# Patient Record
Sex: Female | Born: 1965 | Race: Black or African American | Hispanic: No | Marital: Married | Smoking: Never smoker
Health system: Southern US, Community
[De-identification: ages and names within clinical notes are randomized; demographics above are authoritative.]

## PROBLEM LIST (undated history)

## (undated) DIAGNOSIS — R51 Headache: Secondary | ICD-10-CM

## (undated) DIAGNOSIS — T7840XA Allergy, unspecified, initial encounter: Secondary | ICD-10-CM

## (undated) HISTORY — DX: Allergy, unspecified, initial encounter: T78.40XA

## (undated) HISTORY — PX: TUBAL LIGATION: SHX77

## (undated) HISTORY — PX: OTHER SURGICAL HISTORY: SHX169

---

## 2007-05-09 ENCOUNTER — Encounter
Admission: RE | Admit: 2007-05-09 | Discharge: 2007-07-20 | Payer: Self-pay | Admitting: Physical Medicine and Rehabilitation

## 2007-05-11 ENCOUNTER — Ambulatory Visit (HOSPITAL_COMMUNITY)
Admission: RE | Admit: 2007-05-11 | Discharge: 2007-05-11 | Payer: Self-pay | Admitting: Physical Medicine and Rehabilitation

## 2007-06-16 ENCOUNTER — Encounter
Admission: RE | Admit: 2007-06-16 | Discharge: 2007-06-16 | Payer: Self-pay | Admitting: Physical Medicine and Rehabilitation

## 2007-06-29 ENCOUNTER — Encounter
Admission: RE | Admit: 2007-06-29 | Discharge: 2007-06-29 | Payer: Self-pay | Admitting: Physical Medicine and Rehabilitation

## 2009-11-30 ENCOUNTER — Emergency Department (HOSPITAL_COMMUNITY): Admission: EM | Admit: 2009-11-30 | Discharge: 2009-12-01 | Payer: Self-pay | Admitting: Emergency Medicine

## 2013-01-13 ENCOUNTER — Encounter (HOSPITAL_COMMUNITY): Payer: Self-pay | Admitting: Emergency Medicine

## 2013-01-13 ENCOUNTER — Emergency Department (HOSPITAL_COMMUNITY): Payer: No Typology Code available for payment source

## 2013-01-13 ENCOUNTER — Observation Stay (HOSPITAL_COMMUNITY): Payer: No Typology Code available for payment source

## 2013-01-13 ENCOUNTER — Observation Stay (HOSPITAL_COMMUNITY)
Admission: EM | Admit: 2013-01-13 | Discharge: 2013-01-14 | Disposition: A | Discharge status: Home / Self Care | Payer: No Typology Code available for payment source | Attending: Neurological Surgery | Admitting: Neurological Surgery

## 2013-01-13 DIAGNOSIS — S139XXA Sprain of joints and ligaments of unspecified parts of neck, initial encounter: Principal | ICD-10-CM | POA: Insufficient documentation

## 2013-01-13 DIAGNOSIS — M25519 Pain in unspecified shoulder: Secondary | ICD-10-CM | POA: Insufficient documentation

## 2013-01-13 DIAGNOSIS — M4712 Other spondylosis with myelopathy, cervical region: Secondary | ICD-10-CM | POA: Insufficient documentation

## 2013-01-13 DIAGNOSIS — R109 Unspecified abdominal pain: Secondary | ICD-10-CM | POA: Insufficient documentation

## 2013-01-13 DIAGNOSIS — M5000 Cervical disc disorder with myelopathy, unspecified cervical region: Secondary | ICD-10-CM | POA: Diagnosis present

## 2013-01-13 MED ORDER — SODIUM CHLORIDE 0.9 % IV SOLN
INTRAVENOUS | Status: DC
  Administered 2013-01-13: 15:00:00 via INTRAVENOUS

## 2013-01-13 MED ORDER — MORPHINE SULFATE 2 MG/ML IJ SOLN
1.0000 mg | INTRAMUSCULAR | Status: DC | PRN
  Administered 2013-01-13: 1 mg via INTRAVENOUS
  Filled 2013-01-13: qty 1

## 2013-01-13 MED ORDER — LORATADINE 10 MG PO TABS
10.0000 mg | ORAL_TABLET | Freq: Every day | ORAL | Status: DC
  Filled 2013-01-13 (×2): qty 1

## 2013-01-13 MED ORDER — HYDROCODONE-ACETAMINOPHEN 5-325 MG PO TABS: 1.0000 | ORAL_TABLET | ORAL | Status: DC | PRN

## 2013-01-13 MED ORDER — HYDROMORPHONE HCL PF 1 MG/ML IJ SOLN
0.5000 mg | Freq: Once | INTRAMUSCULAR | Status: AC
  Administered 2013-01-13: 0.5 mg via INTRAVENOUS
  Filled 2013-01-13: qty 1

## 2013-01-13 MED ORDER — ALUM & MAG HYDROXIDE-SIMETH 200-200-20 MG/5ML PO SUSP: 30.0000 mL | Freq: Four times a day (QID) | ORAL | Status: DC | PRN

## 2013-01-13 MED ORDER — KETOROLAC TROMETHAMINE 15 MG/ML IJ SOLN
15.0000 mg | Freq: Four times a day (QID) | INTRAMUSCULAR | Status: DC | PRN
  Administered 2013-01-13: 15 mg via INTRAVENOUS
  Filled 2013-01-13: qty 1

## 2013-01-13 MED ORDER — DOCUSATE SODIUM 100 MG PO CAPS
100.0000 mg | ORAL_CAPSULE | Freq: Two times a day (BID) | ORAL | Status: DC
  Administered 2013-01-13 – 2013-01-14 (×3): 100 mg via ORAL
  Filled 2013-01-13 (×2): qty 1

## 2013-01-13 MED ORDER — POLYETHYLENE GLYCOL 3350 17 G PO PACK: 17.0000 g | PACK | Freq: Every day | ORAL | Status: DC | PRN

## 2013-01-13 MED ORDER — BISACODYL 10 MG RE SUPP: 10.0000 mg | Freq: Every day | RECTAL | Status: DC | PRN

## 2013-01-13 MED ORDER — SENNA 8.6 MG PO TABS
1.0000 | ORAL_TABLET | Freq: Two times a day (BID) | ORAL | Status: DC
  Administered 2013-01-13 – 2013-01-14 (×2): 8.6 mg via ORAL
  Filled 2013-01-13 (×3): qty 1

## 2013-01-13 MED ORDER — DEXAMETHASONE SODIUM PHOSPHATE 4 MG/ML IJ SOLN
8.0000 mg | Freq: Four times a day (QID) | INTRAMUSCULAR | Status: DC
  Administered 2013-01-13 – 2013-01-14 (×3): 8 mg via INTRAVENOUS
  Filled 2013-01-13 (×8): qty 2
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
## 2013-01-13 NOTE — ED Notes (Signed)
Aspen collar applied while maintaining c spine  

## 2013-01-13 NOTE — ED Notes (Signed)
Patient remains in radiology
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
## 2013-01-13 NOTE — ED Notes (Signed)
Report called to Kenney Houseman, RN unit 5N.

## 2013-01-14 MED ORDER — PREDNISONE (PAK) 10 MG PO TABS: ORAL_TABLET | ORAL | Status: DC

## 2013-01-14 MED ORDER — HYDROCODONE-ACETAMINOPHEN 5-325 MG PO TABS: 1.0000 | ORAL_TABLET | ORAL | Status: DC | PRN

## 2013-01-14 NOTE — Progress Notes (Signed)
Patient ID: Meghan Travis, female   DOB: 02-14-1965, 47 y.o.   MRN: [id number] Vital signs have been stable. Clinically patient feels much improved. Left upper extremity strength appears 5 out of 5 in intrinsics biceps deltoid triceps and grip. Station and gait are intact. Pain under reasonable control. We'll discharge home in a hard cervical collar. Followup in 2 weeks' time. Six-day prednisone dose course.
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
## 2013-02-14 ENCOUNTER — Other Ambulatory Visit: Payer: Self-pay | Admitting: Neurological Surgery

## 2013-02-15 ENCOUNTER — Encounter (HOSPITAL_COMMUNITY): Payer: Self-pay | Admitting: Pharmacy Technician

## 2013-02-16 ENCOUNTER — Encounter (HOSPITAL_COMMUNITY): Payer: Self-pay | Admitting: *Deleted

## 2013-02-19 ENCOUNTER — Encounter (HOSPITAL_COMMUNITY): Payer: No Typology Code available for payment source | Admitting: Certified Registered Nurse Anesthetist

## 2013-02-19 ENCOUNTER — Ambulatory Visit (HOSPITAL_COMMUNITY)
Admission: RE | Admit: 2013-02-19 | Discharge: 2013-02-20 | Disposition: A | Discharge status: Home / Self Care | Payer: No Typology Code available for payment source | Source: Ambulatory Visit | Attending: Neurological Surgery | Admitting: Neurological Surgery

## 2013-02-19 ENCOUNTER — Ambulatory Visit (HOSPITAL_COMMUNITY): Payer: No Typology Code available for payment source | Admitting: Certified Registered Nurse Anesthetist

## 2013-02-19 ENCOUNTER — Encounter (HOSPITAL_COMMUNITY): Payer: Self-pay | Admitting: *Deleted

## 2013-02-19 ENCOUNTER — Encounter (HOSPITAL_COMMUNITY)
Admission: RE | Disposition: A | Discharge status: Home / Self Care | Payer: Self-pay | Source: Ambulatory Visit | Attending: Neurological Surgery

## 2013-02-19 ENCOUNTER — Ambulatory Visit (HOSPITAL_COMMUNITY): Payer: No Typology Code available for payment source

## 2013-02-19 DIAGNOSIS — M5020 Other cervical disc displacement, unspecified cervical region: Secondary | ICD-10-CM | POA: Insufficient documentation

## 2013-02-19 DIAGNOSIS — M4712 Other spondylosis with myelopathy, cervical region: Secondary | ICD-10-CM | POA: Insufficient documentation

## 2013-02-19 HISTORY — PX: ANTERIOR CERVICAL DECOMP/DISCECTOMY FUSION: SHX1161

## 2013-02-19 HISTORY — DX: Headache: R51

## 2013-02-19 LAB — CBC
HCT: 36.4 % (ref 36.0–46.0)
Hemoglobin: 12.5 g/dL (ref 12.0–15.0)
MCH: 32.1 pg (ref 26.0–34.0)
MCHC: 34.3 g/dL (ref 30.0–36.0)
MCV: 93.3 fL (ref 78.0–100.0)
Platelets: 212 10*3/uL (ref 150–400)
RBC: 3.9 MIL/uL (ref 3.87–5.11)
RDW: 12.7 % (ref 11.5–15.5)
WBC: 3.2 10*3/uL — ABNORMAL LOW (ref 4.0–10.5)

## 2013-02-19 LAB — SURGICAL PCR SCREEN
MRSA, PCR: NEGATIVE
Staphylococcus aureus: NEGATIVE

## 2013-02-19 LAB — HCG, SERUM, QUALITATIVE: Preg, Serum: NEGATIVE

## 2013-02-19 SURGERY — ANTERIOR CERVICAL DECOMPRESSION/DISCECTOMY FUSION 2 LEVELS
Anesthesia: General | Site: Spine Cervical

## 2013-02-19 MED ORDER — LORATADINE 10 MG PO TABS
10.0000 mg | ORAL_TABLET | Freq: Every day | ORAL | Status: DC
  Filled 2013-02-19 (×2): qty 1

## 2013-02-19 MED ORDER — BUPIVACAINE HCL (PF) 0.5 % IJ SOLN
INTRAMUSCULAR | Status: DC | PRN
  Administered 2013-02-19: 3.5 mL

## 2013-02-19 MED ORDER — OXYCODONE-ACETAMINOPHEN 5-325 MG PO TABS: 1.0000 | ORAL_TABLET | ORAL | Status: DC | PRN

## 2013-02-19 MED ORDER — DOCUSATE SODIUM 100 MG PO CAPS
100.0000 mg | ORAL_CAPSULE | Freq: Two times a day (BID) | ORAL | Status: DC
  Administered 2013-02-20: 100 mg via ORAL
  Filled 2013-02-19 (×4): qty 1

## 2013-02-19 MED ORDER — LIDOCAINE-EPINEPHRINE 1 %-1:100000 IJ SOLN
INTRAMUSCULAR | Status: DC | PRN
  Administered 2013-02-19: 3.5 mL

## 2013-02-19 MED ORDER — SODIUM CHLORIDE 0.9 % IJ SOLN: 3.0000 mL | INTRAMUSCULAR | Status: DC | PRN

## 2013-02-19 MED ORDER — MENTHOL 3 MG MT LOZG
1.0000 | LOZENGE | OROMUCOSAL | Status: DC | PRN
Start: 2013-02-19 — End: 2013-02-20

## 2013-02-19 MED ORDER — MEPERIDINE HCL 25 MG/ML IJ SOLN: 6.2500 mg | INTRAMUSCULAR | Status: DC | PRN

## 2013-02-19 MED ORDER — OXYCODONE HCL 5 MG/5ML PO SOLN: 5.0000 mg | Freq: Once | ORAL | Status: DC | PRN

## 2013-02-19 MED ORDER — PROMETHAZINE HCL 25 MG/ML IJ SOLN: 6.2500 mg | INTRAMUSCULAR | Status: DC | PRN

## 2013-02-19 MED ORDER — PHENYLEPHRINE HCL 10 MG/ML IJ SOLN
INTRAMUSCULAR | Status: DC | PRN
  Administered 2013-02-19 (×3): 80 ug via INTRAVENOUS

## 2013-02-19 MED ORDER — FENTANYL CITRATE 0.05 MG/ML IJ SOLN
INTRAMUSCULAR | Status: DC | PRN
  Administered 2013-02-19: 150 ug via INTRAVENOUS
  Administered 2013-02-19: 100 ug via INTRAVENOUS
  Administered 2013-02-19 (×2): 50 ug via INTRAVENOUS

## 2013-02-19 MED ORDER — MUPIROCIN 2 % EX OINT
TOPICAL_OINTMENT | CUTANEOUS | Status: AC
  Filled 2013-02-19: qty 22

## 2013-02-19 MED ORDER — MIDAZOLAM HCL 5 MG/5ML IJ SOLN
INTRAMUSCULAR | Status: DC | PRN
  Administered 2013-02-19: 2 mg via INTRAVENOUS

## 2013-02-19 MED ORDER — CEFAZOLIN SODIUM-DEXTROSE 2-3 GM-% IV SOLR
INTRAVENOUS | Status: AC
  Administered 2013-02-19: 2 g via INTRAVENOUS
  Filled 2013-02-19: qty 50

## 2013-02-19 MED ORDER — POLYETHYLENE GLYCOL 3350 17 G PO PACK
17.0000 g | PACK | Freq: Every day | ORAL | Status: DC | PRN
  Filled 2013-02-19: qty 1

## 2013-02-19 MED ORDER — MORPHINE SULFATE 2 MG/ML IJ SOLN: 1.0000 mg | INTRAMUSCULAR | Status: DC | PRN

## 2013-02-19 MED ORDER — DEXAMETHASONE SODIUM PHOSPHATE 10 MG/ML IJ SOLN
INTRAMUSCULAR | Status: AC
  Administered 2013-02-19: 10 mg via INTRAVENOUS
  Filled 2013-02-19: qty 1

## 2013-02-19 MED ORDER — ONDANSETRON HCL 4 MG/2ML IJ SOLN
INTRAMUSCULAR | Status: DC | PRN
  Administered 2013-02-19: 4 mg via INTRAVENOUS

## 2013-02-19 MED ORDER — PHENOL 1.4 % MT LIQD: 1.0000 | OROMUCOSAL | Status: DC | PRN

## 2013-02-19 MED ORDER — METHOCARBAMOL 500 MG PO TABS: 500.0000 mg | ORAL_TABLET | Freq: Four times a day (QID) | ORAL | Status: DC | PRN

## 2013-02-19 MED ORDER — METHOCARBAMOL 100 MG/ML IJ SOLN
500.0000 mg | Freq: Four times a day (QID) | INTRAMUSCULAR | Status: DC | PRN
  Filled 2013-02-19: qty 5

## 2013-02-19 MED ORDER — LACTATED RINGERS IV SOLN
INTRAVENOUS | Status: DC | PRN
  Administered 2013-02-19 (×2): via INTRAVENOUS

## 2013-02-19 MED ORDER — 0.9 % SODIUM CHLORIDE (POUR BTL) OPTIME
TOPICAL | Status: DC | PRN
  Administered 2013-02-19: 1000 mL

## 2013-02-19 MED ORDER — ALUM & MAG HYDROXIDE-SIMETH 200-200-20 MG/5ML PO SUSP: 30.0000 mL | Freq: Four times a day (QID) | ORAL | Status: DC | PRN

## 2013-02-19 MED ORDER — DEXAMETHASONE 4 MG PO TABS
4.0000 mg | ORAL_TABLET | Freq: Two times a day (BID) | ORAL | Status: DC
  Administered 2013-02-20: 4 mg via ORAL
  Filled 2013-02-19 (×2): qty 1

## 2013-02-19 MED ORDER — NEOSTIGMINE METHYLSULFATE 1 MG/ML IJ SOLN
INTRAMUSCULAR | Status: DC | PRN
  Administered 2013-02-19: 3 mg via INTRAVENOUS

## 2013-02-19 MED ORDER — MUPIROCIN 2 % EX OINT
TOPICAL_OINTMENT | Freq: Two times a day (BID) | CUTANEOUS | Status: DC
  Administered 2013-02-19: 1 via NASAL
  Filled 2013-02-19: qty 22

## 2013-02-19 MED ORDER — SODIUM CHLORIDE 0.9 % IV SOLN
250.0000 mL | INTRAVENOUS | Status: DC
  Administered 2013-02-19: 250 mL via INTRAVENOUS

## 2013-02-19 MED ORDER — ONDANSETRON HCL 4 MG/2ML IJ SOLN
4.0000 mg | INTRAMUSCULAR | Status: DC | PRN
  Administered 2013-02-19 (×2): 4 mg via INTRAVENOUS
  Filled 2013-02-19 (×2): qty 2

## 2013-02-19 MED ORDER — EPHEDRINE SULFATE 50 MG/ML IJ SOLN
INTRAMUSCULAR | Status: DC | PRN
  Administered 2013-02-19: 10 mg via INTRAVENOUS

## 2013-02-19 MED ORDER — SENNA 8.6 MG PO TABS
1.0000 | ORAL_TABLET | Freq: Two times a day (BID) | ORAL | Status: DC
  Administered 2013-02-20: 8.6 mg via ORAL
  Filled 2013-02-19 (×2): qty 1

## 2013-02-19 MED ORDER — HEMOSTATIC AGENTS (NO CHARGE) OPTIME
TOPICAL | Status: DC | PRN
  Administered 2013-02-19: 1 via TOPICAL

## 2013-02-19 MED ORDER — PROPOFOL 10 MG/ML IV BOLUS
INTRAVENOUS | Status: DC | PRN
  Administered 2013-02-19: 100 mg via INTRAVENOUS

## 2013-02-19 MED ORDER — HYDROMORPHONE HCL PF 1 MG/ML IJ SOLN: 0.2500 mg | INTRAMUSCULAR | Status: DC | PRN

## 2013-02-19 MED ORDER — ACETAMINOPHEN 650 MG RE SUPP: 650.0000 mg | RECTAL | Status: DC | PRN

## 2013-02-19 MED ORDER — GLYCOPYRROLATE 0.2 MG/ML IJ SOLN
INTRAMUSCULAR | Status: DC | PRN
  Administered 2013-02-19: 0.4 mg via INTRAVENOUS

## 2013-02-19 MED ORDER — DEXAMETHASONE SODIUM PHOSPHATE 4 MG/ML IJ SOLN
4.0000 mg | Freq: Two times a day (BID) | INTRAMUSCULAR | Status: DC
  Administered 2013-02-19: 4 mg via INTRAVENOUS
  Filled 2013-02-19 (×3): qty 1

## 2013-02-19 MED ORDER — SODIUM CHLORIDE 0.9 % IJ SOLN
3.0000 mL | Freq: Two times a day (BID) | INTRAMUSCULAR | Status: DC
  Administered 2013-02-19 – 2013-02-20 (×3): 3 mL via INTRAVENOUS

## 2013-02-19 MED ORDER — MIDAZOLAM HCL 2 MG/2ML IJ SOLN: 0.5000 mg | Freq: Once | INTRAMUSCULAR | Status: DC | PRN

## 2013-02-19 MED ORDER — BACITRACIN 50000 UNITS IM SOLR
INTRAMUSCULAR | Status: DC | PRN
  Administered 2013-02-19: 09:00:00

## 2013-02-19 MED ORDER — ROCURONIUM BROMIDE 100 MG/10ML IV SOLN
INTRAVENOUS | Status: DC | PRN
  Administered 2013-02-19: 10 mg via INTRAVENOUS
  Administered 2013-02-19: 40 mg via INTRAVENOUS

## 2013-02-19 MED ORDER — BISACODYL 10 MG RE SUPP: 10.0000 mg | Freq: Every day | RECTAL | Status: DC | PRN

## 2013-02-19 MED ORDER — HYDROCODONE-ACETAMINOPHEN 5-325 MG PO TABS
1.0000 | ORAL_TABLET | ORAL | Status: DC | PRN
  Administered 2013-02-19: 2 via ORAL
  Filled 2013-02-19: qty 2

## 2013-02-19 MED ORDER — ACETAMINOPHEN 325 MG PO TABS
650.0000 mg | ORAL_TABLET | ORAL | Status: DC | PRN
  Administered 2013-02-20: 650 mg via ORAL
  Filled 2013-02-19: qty 2

## 2013-02-19 MED ORDER — LIDOCAINE HCL (CARDIAC) 20 MG/ML IV SOLN
INTRAVENOUS | Status: DC | PRN
  Administered 2013-02-19: 40 mg via INTRAVENOUS

## 2013-02-19 MED ORDER — OXYCODONE HCL 5 MG PO TABS: 5.0000 mg | ORAL_TABLET | Freq: Once | ORAL | Status: DC | PRN

## 2013-02-19 MED ORDER — THROMBIN 5000 UNITS EX SOLR
CUTANEOUS | Status: DC | PRN
  Administered 2013-02-19 (×2): 5000 [IU] via TOPICAL

## 2013-02-19 SURGICAL SUPPLY — 56 items
BAG DECANTER FOR FLEXI CONT (MISCELLANEOUS) ×2 IMPLANT
BANDAGE GAUZE ELAST BULKY 4 IN (GAUZE/BANDAGES/DRESSINGS) ×4 IMPLANT
BIT DRILL NEURO 2X3.1 SFT TUCH (MISCELLANEOUS) ×1 IMPLANT
BUR BARREL STRAIGHT FLUTE 4.0 (BURR) ×2 IMPLANT
CANISTER SUCT 3000ML (MISCELLANEOUS) ×2 IMPLANT
CONT SPEC 4OZ CLIKSEAL STRL BL (MISCELLANEOUS) ×2 IMPLANT
DECANTER SPIKE VIAL GLASS SM (MISCELLANEOUS) ×2 IMPLANT
DERMABOND ADHESIVE PROPEN (GAUZE/BANDAGES/DRESSINGS) ×1
DERMABOND ADVANCED (GAUZE/BANDAGES/DRESSINGS)
DERMABOND ADVANCED .7 DNX12 (GAUZE/BANDAGES/DRESSINGS) IMPLANT
DERMABOND ADVANCED .7 DNX6 (GAUZE/BANDAGES/DRESSINGS) ×1 IMPLANT
DRAPE LAPAROTOMY 100X72 PEDS (DRAPES) ×2 IMPLANT
DRAPE MICROSCOPE LEICA (MISCELLANEOUS) IMPLANT
DRAPE POUCH INSTRU U-SHP 10X18 (DRAPES) ×2 IMPLANT
DRILL BIT HELIX (BIT) ×2 IMPLANT
DRILL NEURO 2X3.1 SOFT TOUCH (MISCELLANEOUS) ×2
DURAPREP 6ML APPLICATOR 50/CS (WOUND CARE) ×2 IMPLANT
ELECT REM PT RETURN 9FT ADLT (ELECTROSURGICAL) ×2
ELECTRODE REM PT RTRN 9FT ADLT (ELECTROSURGICAL) ×1 IMPLANT
GAUZE SPONGE 4X4 16PLY XRAY LF (GAUZE/BANDAGES/DRESSINGS) IMPLANT
GLOVE BIOGEL PI IND STRL 8 (GLOVE) ×2 IMPLANT
GLOVE BIOGEL PI IND STRL 8.5 (GLOVE) ×1 IMPLANT
GLOVE BIOGEL PI INDICATOR 8 (GLOVE) ×2
GLOVE BIOGEL PI INDICATOR 8.5 (GLOVE) ×1
GLOVE ECLIPSE 7.5 STRL STRAW (GLOVE) ×8 IMPLANT
GLOVE ECLIPSE 8.5 STRL (GLOVE) ×4 IMPLANT
GLOVE EXAM NITRILE LRG STRL (GLOVE) IMPLANT
GLOVE EXAM NITRILE MD LF STRL (GLOVE) IMPLANT
GLOVE EXAM NITRILE XL STR (GLOVE) IMPLANT
GLOVE EXAM NITRILE XS STR PU (GLOVE) IMPLANT
GOWN BRE IMP SLV AUR LG STRL (GOWN DISPOSABLE) ×2 IMPLANT
GOWN BRE IMP SLV AUR XL STRL (GOWN DISPOSABLE) IMPLANT
GOWN STRL REIN 2XL LVL4 (GOWN DISPOSABLE) ×4 IMPLANT
HEAD HALTER (SOFTGOODS) ×2 IMPLANT
INTERLOCK LORDOTIC CC 5X11X14M ×2 IMPLANT
KIT BASIN OR (CUSTOM PROCEDURE TRAY) ×2 IMPLANT
KIT ROOM TURNOVER OR (KITS) ×2 IMPLANT
LORDOTIC CC ALLOGRAFT 5X11X14M ×4 IMPLANT
NEEDLE HYPO 22GX1.5 SAFETY (NEEDLE) ×2 IMPLANT
NEEDLE SPNL 22GX3.5 QUINCKE BK (NEEDLE) ×4 IMPLANT
NS IRRIG 1000ML POUR BTL (IV SOLUTION) ×2 IMPLANT
PACK LAMINECTOMY NEURO (CUSTOM PROCEDURE TRAY) ×2 IMPLANT
PAD ARMBOARD 7.5X6 YLW CONV (MISCELLANEOUS) ×6 IMPLANT
PLATE HELIX T 34MM (Plate) ×2 IMPLANT
PUTTY DBX 1CC (Putty) ×2 IMPLANT
PUTTY DBX 1CC DEPUY (Putty) ×1 IMPLANT
RUBBERBAND STERILE (MISCELLANEOUS) IMPLANT
SCREW HELIX R SELF TAP 11MM (Screw) ×12 IMPLANT
SPONGE GAUZE 4X4 12PLY (GAUZE/BANDAGES/DRESSINGS) IMPLANT
SPONGE INTESTINAL PEANUT (DISPOSABLE) ×2 IMPLANT
SPONGE SURGIFOAM ABS GEL SZ50 (HEMOSTASIS) ×2 IMPLANT
SUT VIC AB 3-0 SH 8-18 (SUTURE) ×4 IMPLANT
SYR 20ML ECCENTRIC (SYRINGE) ×2 IMPLANT
TOWEL OR 17X24 6PK STRL BLUE (TOWEL DISPOSABLE) ×2 IMPLANT
TOWEL OR 17X26 10 PK STRL BLUE (TOWEL DISPOSABLE) ×2 IMPLANT
WATER STERILE IRR 1000ML POUR (IV SOLUTION) ×2 IMPLANT

## 2013-02-19 NOTE — Transfer of Care (Signed)
Immediate Anesthesia Transfer of Care Note  Patient: Meghan Travis  Procedure(s) Performed: Procedure(s): CERVICAL FOUR-FIVE,CERVICAL FIVE -SIX ANTERIOR CERVICAL DECOMPRESSION/FUSION (N/A)  Patient Location: PACU  Anesthesia Type:General  Level of Consciousness: awake, alert  and oriented  Airway & Oxygen Therapy: Patient Spontanous Breathing and Patient connected to nasal cannula oxygen  Post-op Assessment: Report given to PACU RN, Post -op Vital signs reviewed and stable and Patient moving all extremities  Post vital signs: Reviewed and stable  Complications: No apparent anesthesia complications

## 2013-02-19 NOTE — Progress Notes (Signed)
Transported by Dora NT  

## 2013-02-19 NOTE — Anesthesia Procedure Notes (Signed)
Procedure Name: Intubation Date/Time: 02/19/2013 8:00 AM Performed by: Trixie Deis A Pre-anesthesia Checklist: Patient identified, Timeout performed, Emergency Drugs available, Suction available and Patient being monitored Patient Re-evaluated:Patient Re-evaluated prior to inductionOxygen Delivery Method: Circle system utilized Preoxygenation: Pre-oxygenation with 100% oxygen Intubation Type: IV induction Ventilation: Mask ventilation without difficulty Tube type: Oral Tube size: 7.0 mm Number of attempts: 1 Airway Equipment and Method: Rigid stylet,  LTA kit utilized and Video-laryngoscopy (glidescope used due to myelopathy) Placement Confirmation: breath sounds checked- equal and bilateral,  ETT inserted through vocal cords under direct vision and positive ETCO2 Secured at: 21 cm Tube secured with: Tape Dental Injury: Teeth and Oropharynx as per pre-operative assessment
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
## 2013-02-19 NOTE — Anesthesia Postprocedure Evaluation (Signed)
  Anesthesia Post-op Note  Patient: Jonna MunroSharon H Butzin  Procedure(s) Performed: Procedure(s): CERVICAL FOUR-FIVE,CERVICAL FIVE -SIX ANTERIOR CERVICAL DECOMPRESSION/FUSION (N/A)  Patient Location: PACU  Anesthesia Type:General  Level of Consciousness: awake, alert , oriented and patient cooperative  Airway and Oxygen Therapy: Patient Spontanous Breathing and Patient connected to nasal cannula oxygen  Post-op Pain: none  Post-op Assessment: Post-op Vital signs reviewed, Patient's Cardiovascular Status Stable, Respiratory Function Stable, Patent Airway, No signs of Nausea or vomiting and Pain level controlled  Post-op Vital Signs: Reviewed and stable  Complications: No apparent anesthesia complications
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
## 2013-02-19 NOTE — Plan of Care (Signed)
Problem: Consults Goal: Diagnosis - Spinal Surgery Outcome: Completed/Met Date Met:  02/19/13 Cervical Spine Fusion
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
## 2013-02-20 ENCOUNTER — Encounter (HOSPITAL_COMMUNITY): Payer: Self-pay | Admitting: Neurological Surgery

## 2013-02-20 MED ORDER — OXYCODONE-ACETAMINOPHEN 5-325 MG PO TABS: 1.0000 | ORAL_TABLET | ORAL | Status: DC | PRN

## 2013-02-20 MED ORDER — METHOCARBAMOL 500 MG PO TABS: 500.0000 mg | ORAL_TABLET | Freq: Four times a day (QID) | ORAL | Status: DC | PRN

## 2013-02-20 MED ORDER — DEXAMETHASONE 1 MG PO TABS: ORAL_TABLET | ORAL | Status: DC

## 2013-02-20 NOTE — Progress Notes (Signed)
Pt. Alert and oriented,follows simple instructions, denies pain. Incision area without swelling, redness or S/S of infection. Voiding adequate clear yellow urine. Moving all extremities well and vitals stable and documented. Patient discharged home with family. Anterior cervical surgery notes instructions given to patient and family member for home safety and precautions. Pt. and family stated understanding of instructions given. 

## 2013-02-20 NOTE — Discharge Summary (Signed)
Physician Discharge Summary  Patient ID: Meghan Travis MRN: [id number] DOB/AGE: 47-15-1967 47 y.o.  Admit date: 02/19/2013 Discharge date: 02/20/2013  Admission Diagnoses: Cervical spondylosis with myelopathy and herniated nucleus pulposus C4-5 C5-C6, quadriparesis  Discharge Diagnoses: Cervical spondylosis with myelopathy and herniated nucleus pulposus C4-5 and C5-6, quadriparesis Active Problems:   Cervical spondylosis with myelopathy   Discharged Condition: good  Hospital Course: Patient was admitted to undergo anterior cervical decompression arthrodesis at C4-5 and C5-6. She tolerated the surgery well.  Consults: None  Significant Diagnostic Studies: None  Treatments: Anterior cervical decompression C4-5 C5-6 arthrodesis with structural allograft anterior plate fixation [id number]  Discharge Exam: Blood pressure 112/71, pulse 83, temperature 99.2 F (37.3 C), temperature source Oral, resp. rate 20, height 5\' 6"  (1.676 m), weight 58.514 kg (129 lb), last menstrual period 02/01/2013, SpO2 98.00%. Incision is clean and dry. Motor function appears good in the deltoids biceps triceps grips and intrinsics. Station and gait is improving.  Disposition: 01-Home or Self Care  Discharge Orders   Future Orders Complete By Expires   Call MD for:  redness, tenderness, or signs of infection (pain, swelling, redness, odor or green/yellow discharge around incision site)  As directed    Call MD for:  severe uncontrolled pain  As directed    Call MD for:  temperature >100.4  As directed    Diet - low sodium heart healthy  As directed    Increase activity slowly  As directed        Medication List         dexamethasone 1 MG tablet  Commonly known as:  DECADRON  2 tablets twice daily for 2 days then 1 tablet twice daily for 2 days then 1 tablet daily until finished.     HYDROcodone-acetaminophen 5-325 MG per tablet  Commonly known as:  NORCO/VICODIN  Take 1-2 tablets by mouth every 3  (three) hours as needed for moderate pain.     ibuprofen 200 MG tablet  Commonly known as:  ADVIL,MOTRIN  Take 200 mg by mouth every 6 (six) hours as needed for mild pain or moderate pain.     loratadine 10 MG tablet  Commonly known as:  CLARITIN  Take 10 mg by mouth daily.     methocarbamol 500 MG tablet  Commonly known as:  ROBAXIN  Take 1 tablet (500 mg total) by mouth every 6 (six) hours as needed for muscle spasms.     oxyCODONE-acetaminophen 5-325 MG per tablet  Commonly known as:  PERCOCET/ROXICET  Take 1-2 tablets by mouth every 4 (four) hours as needed for moderate pain.         SignedStefani Dama: [name] 02/20/2013, 1:41 PM
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
## 2013-10-12 ENCOUNTER — Ambulatory Visit (INDEPENDENT_AMBULATORY_CARE_PROVIDER_SITE_OTHER): Payer: BC Managed Care – PPO | Admitting: Family Medicine

## 2013-10-12 VITALS — BP 110/72 | HR 80 | Temp 97.9°F | Resp 18 | Ht 66.5 in | Wt 128.0 lb

## 2013-10-12 DIAGNOSIS — Z1329 Encounter for screening for other suspected endocrine disorder: Secondary | ICD-10-CM

## 2013-10-12 DIAGNOSIS — M62838 Other muscle spasm: Secondary | ICD-10-CM

## 2013-10-12 DIAGNOSIS — Z130 Encounter for screening for diseases of the blood and blood-forming organs and certain disorders involving the immune mechanism: Secondary | ICD-10-CM

## 2013-10-12 DIAGNOSIS — Z124 Encounter for screening for malignant neoplasm of cervix: Secondary | ICD-10-CM

## 2013-10-12 DIAGNOSIS — Z13220 Encounter for screening for lipoid disorders: Secondary | ICD-10-CM

## 2013-10-12 DIAGNOSIS — J018 Other acute sinusitis: Secondary | ICD-10-CM

## 2013-10-12 DIAGNOSIS — Z0000 Encounter for general adult medical examination without abnormal findings: Secondary | ICD-10-CM

## 2013-10-12 DIAGNOSIS — J3089 Other allergic rhinitis: Secondary | ICD-10-CM

## 2013-10-12 DIAGNOSIS — Z131 Encounter for screening for diabetes mellitus: Secondary | ICD-10-CM

## 2013-10-12 DIAGNOSIS — Z23 Encounter for immunization: Secondary | ICD-10-CM

## 2013-10-12 MED ORDER — MONTELUKAST SODIUM 10 MG PO TABS: 10.0000 mg | ORAL_TABLET | Freq: Every day | ORAL | Status: DC

## 2013-10-12 MED ORDER — BACLOFEN 10 MG PO TABS: ORAL_TABLET | ORAL | Status: DC

## 2013-10-12 MED ORDER — AMOXICILLIN 500 MG PO CAPS: 1000.0000 mg | ORAL_CAPSULE | Freq: Two times a day (BID) | ORAL | Status: DC
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
## 2013-10-12 NOTE — Patient Instructions (Addendum)
I will be in touch with your labs and pap report.  The fastest way to get your labs is to sign up for mychart!    Please schedule a screening mammogram soon!  [facility]  Address: [address], [place], [place] [address]  [phone]  For allergies, continue your claritin but try adding singulair once a day Amoxicillin for sinus infection   Try the baclofen for muscle spasm to see if it is more helpful/

## 2013-10-13 LAB — COMPREHENSIVE METABOLIC PANEL
ALT: 10 U/L (ref 0–35)
AST: 18 U/L (ref 0–37)
Albumin: 4.6 g/dL (ref 3.5–5.2)
Alkaline Phosphatase: 81 U/L (ref 39–117)
BUN: 15 mg/dL (ref 6–23)
CO2: 25 mEq/L (ref 19–32)
Calcium: 9.5 mg/dL (ref 8.4–10.5)
Chloride: 103 mEq/L (ref 96–112)
Creat: 0.62 mg/dL (ref 0.50–1.10)
Glucose, Bld: 93 mg/dL (ref 70–99)
Potassium: 3.9 mEq/L (ref 3.5–5.3)
Sodium: 138 mEq/L (ref 135–145)
Total Bilirubin: 0.6 mg/dL (ref 0.2–1.2)
Total Protein: 7.4 g/dL (ref 6.0–8.3)

## 2013-10-13 LAB — CBC
HCT: 37.2 % (ref 36.0–46.0)
Hemoglobin: 12.9 g/dL (ref 12.0–15.0)
MCH: 31.9 pg (ref 26.0–34.0)
MCHC: 34.7 g/dL (ref 30.0–36.0)
MCV: 92.1 fL (ref 78.0–100.0)
Platelets: 205 10*3/uL (ref 150–400)
RBC: 4.04 MIL/uL (ref 3.87–5.11)
RDW: 13.6 % (ref 11.5–15.5)
WBC: 3.5 10*3/uL — ABNORMAL LOW (ref 4.0–10.5)

## 2013-10-13 LAB — LDL CHOLESTEROL, DIRECT: Direct LDL: 89 mg/dL

## 2013-10-13 LAB — TSH: TSH: 0.783 u[IU]/mL (ref 0.350–4.500)

## 2013-10-16 ENCOUNTER — Encounter: Payer: Self-pay | Admitting: Family Medicine

## 2013-10-16 LAB — PAP IG AND HPV HIGH-RISK: HPV DNA High Risk: NOT DETECTED

## 2013-10-22 ENCOUNTER — Telehealth: Payer: Self-pay | Admitting: Radiology

## 2013-10-22 NOTE — Telephone Encounter (Signed)
Called her- I have sent her a letter with details but I am sorry she has not received it yet.  All her labs looked ok.  Let me know if she does not receive it soon

## 2013-10-22 NOTE — Telephone Encounter (Signed)
Pt calling about lab results  

## 2014-05-16 ENCOUNTER — Ambulatory Visit (INDEPENDENT_AMBULATORY_CARE_PROVIDER_SITE_OTHER): Payer: BLUE CROSS/BLUE SHIELD

## 2014-05-16 ENCOUNTER — Ambulatory Visit (INDEPENDENT_AMBULATORY_CARE_PROVIDER_SITE_OTHER): Payer: BLUE CROSS/BLUE SHIELD | Admitting: Family Medicine

## 2014-05-16 VITALS — BP 115/74 | HR 116 | Temp 102.7°F | Resp 18

## 2014-05-16 DIAGNOSIS — R05 Cough: Secondary | ICD-10-CM

## 2014-05-16 DIAGNOSIS — R509 Fever, unspecified: Secondary | ICD-10-CM

## 2014-05-16 DIAGNOSIS — R059 Cough, unspecified: Secondary | ICD-10-CM

## 2014-05-16 MED ORDER — IBUPROFEN 200 MG PO TABS
400.0000 mg | ORAL_TABLET | Freq: Once | ORAL | Status: AC
  Administered 2014-05-16: 400 mg via ORAL

## 2014-05-16 MED ORDER — ONDANSETRON 4 MG PO TBDP: 4.0000 mg | ORAL_TABLET | Freq: Three times a day (TID) | ORAL | Status: DC | PRN

## 2014-05-16 MED ORDER — OSELTAMIVIR PHOSPHATE 75 MG PO CAPS: 75.0000 mg | ORAL_CAPSULE | Freq: Two times a day (BID) | ORAL | Status: DC

## 2014-05-16 NOTE — Progress Notes (Addendum)
Meghan HaroldSharon Travis is a 49 y.o. female who presents to Urgent Care today with complaints of malaise:  1.  Illness:  Patient with cough, body aches, and chills x 2-3 days.  No known sick contacts.  No known fevers, only inability to get warm, especially at night.  Rhinorrhea which is clear. Cough in nonproductive.  Nausea without overt vomiting.  Hasn't eaten past several days.  Is drinking PO fluids.  Biggest complaint is myalgias.  Has tried Ibuprofen and hydrocodone at home without relief.    Received flu shot in September  PMH reviewed.  Past Medical History  Diagnosis Date  . Headache(784.0)     miagrine-  . Allergy    Past Surgical History  Procedure Laterality Date  . Cesarean section    . Anterior cervical decomp/discectomy fusion N/A 02/19/2013    Procedure: CERVICAL FOUR-FIVE,CERVICAL FIVE -SIX ANTERIOR CERVICAL DECOMPRESSION/FUSION;  Surgeon: Barnett AbuHenry Elsner, MD;  Location: [facility];  Service: Neurosurgery;  Laterality: N/A;  . Tubal ligation    . Tubal reveral       Medications reviewed. Current Outpatient Prescriptions  Medication Sig Dispense Refill  . ibuprofen (ADVIL,MOTRIN) 200 MG tablet Take 200 mg by mouth every 6 (six) hours as needed for mild pain or moderate pain.    . montelukast (SINGULAIR) 10 MG tablet Take 1 tablet (10 mg total) by mouth at bedtime. 30 tablet 5  . baclofen (LIORESAL) 10 MG tablet Start with 5mg  (1/2 tab) up to 3x a day as needed . May increase to a whole pill (Patient not taking: Reported on 05/16/2014) 90 each 1  . loratadine (CLARITIN) 10 MG tablet Take 10 mg by mouth daily.     No current facility-administered medications for this visit.    ROS as above otherwise neg.  No chest pain, palpitations, SOB, Fever, Chills, Abd pain, N/V/D.   Physical Exam:  BP 115/74 mmHg  Pulse 116  Temp(Src) 102.7 F (39.3 C) (Oral)  Resp 18  SpO2 98%  LMP 05/08/2014 Gen:  Patient sitting on exam table, appears stated age in no acute distress.   Ill-appearing but nontoxic. Head: Normocephalic atraumatic Eyes: EOMI, PERRL, sclera and conjunctiva non-erythematous Ears:  Canals clear bilaterally.  TMs pearly gray bilaterally without erythema or bulging.   Nose:  Nasal turbinates grossly enlarged bilaterally. Some clear exudates Neck: No cervical lymphadenopathy noted Heart:  RRR, no murmurs auscultated. Pulm:  Clear to auscultation bilaterally with good air movement.  No wheezes or rales noted.   Abd:  Nontender/benign   [facility] reading (PRIMARY) by  Dr. Gwendolyn GrantWalden:  No acute cardiopulmonary process.  No signs of pneumonia.  Assessment and Plan: 1.  Flu: - treat with oseltamavir.   - Zofran for nausea - anti-pyretics for pain and fevers.   - has multiple pain medications at home due to chronic back pain. - FU in 3-4 days if no improvement, sooner if worsening.

## 2014-05-16 NOTE — Patient Instructions (Signed)
Take the Tamiflu.  This should help knock out your symptoms, especially the muscle aches.  Keep taking the medicine you have for pain at home.  Take the Zofran for your nausea.   If you're not better by Sunday or Monday, you should come back and see us.    It was good to meet you today.

## 2015-04-16 ENCOUNTER — Telehealth: Payer: Self-pay | Admitting: Behavioral Health

## 2015-04-16 ENCOUNTER — Encounter: Payer: Self-pay | Admitting: Behavioral Health

## 2015-04-16 NOTE — Telephone Encounter (Signed)
Pre-Visit Call completed with patient and chart updated.   Pre-Visit Info documented in Specialty Comments under Snap[employer]ot.    

## 2015-04-17 ENCOUNTER — Encounter: Payer: Self-pay | Admitting: Family Medicine

## 2015-04-17 ENCOUNTER — Ambulatory Visit (INDEPENDENT_AMBULATORY_CARE_PROVIDER_SITE_OTHER): Payer: BLUE CROSS/BLUE SHIELD | Admitting: Family Medicine

## 2015-04-17 VITALS — BP 125/87 | HR 70 | Temp 98.2°F | Ht 66.5 in | Wt 132.0 lb

## 2015-04-17 DIAGNOSIS — M549 Dorsalgia, unspecified: Secondary | ICD-10-CM | POA: Diagnosis not present

## 2015-04-17 DIAGNOSIS — Z0000 Encounter for general adult medical examination without abnormal findings: Secondary | ICD-10-CM

## 2015-04-17 DIAGNOSIS — Z13220 Encounter for screening for lipoid disorders: Secondary | ICD-10-CM

## 2015-04-17 DIAGNOSIS — Z0001 Encounter for general adult medical examination with abnormal findings: Secondary | ICD-10-CM | POA: Diagnosis not present

## 2015-04-17 DIAGNOSIS — Z1329 Encounter for screening for other suspected endocrine disorder: Secondary | ICD-10-CM | POA: Diagnosis not present

## 2015-04-17 DIAGNOSIS — G8929 Other chronic pain: Secondary | ICD-10-CM

## 2015-04-17 DIAGNOSIS — Z1239 Encounter for other screening for malignant neoplasm of breast: Secondary | ICD-10-CM | POA: Diagnosis not present

## 2015-04-17 DIAGNOSIS — Z131 Encounter for screening for diabetes mellitus: Secondary | ICD-10-CM

## 2015-04-17 DIAGNOSIS — Z130 Encounter for screening for diseases of the blood and blood-forming organs and certain disorders involving the immune mechanism: Secondary | ICD-10-CM | POA: Diagnosis not present

## 2015-04-17 DIAGNOSIS — R7989 Other specified abnormal findings of blood chemistry: Secondary | ICD-10-CM

## 2015-04-17 LAB — COMPREHENSIVE METABOLIC PANEL
ALT: 12 U/L (ref 0–35)
AST: 19 U/L (ref 0–37)
Albumin: 4.6 g/dL (ref 3.5–5.2)
Alkaline Phosphatase: 90 U/L (ref 39–117)
BUN: 14 mg/dL (ref 6–23)
CO2: 27 mEq/L (ref 19–32)
Calcium: 9.7 mg/dL (ref 8.4–10.5)
Chloride: 105 mEq/L (ref 96–112)
Creatinine, Ser: 0.68 mg/dL (ref 0.40–1.20)
GFR: 118.28 mL/min (ref >60.00)
Glucose, Bld: 90 mg/dL (ref 70–99)
Potassium: 4.3 mEq/L (ref 3.5–5.1)
Sodium: 138 mEq/L (ref 135–145)
Total Bilirubin: 0.6 mg/dL (ref 0.2–1.2)
Total Protein: 7.7 g/dL (ref 6.0–8.3)

## 2015-04-17 LAB — LIPID PANEL
Cholesterol: 167 mg/dL (ref 0–200)
HDL: 64.2 mg/dL (ref >39.00)
LDL Cholesterol: 95 mg/dL (ref 0–99)
NonHDL: 102.88
Total CHOL/HDL Ratio: 3
Triglycerides: 40 mg/dL (ref 0.0–149.0)
VLDL: 8 mg/dL (ref 0.0–40.0)

## 2015-04-17 LAB — TSH: TSH: 0.59 u[IU]/mL (ref 0.35–4.50)

## 2015-04-17 LAB — CBC
HCT: 40.2 % (ref 36.0–46.0)
Hemoglobin: 13.5 g/dL (ref 12.0–15.0)
MCHC: 33.6 g/dL (ref 30.0–36.0)
MCV: 94.7 fl (ref 78.0–100.0)
Platelets: 199 10*3/uL (ref 150.0–400.0)
RBC: 4.24 Mil/uL (ref 3.87–5.11)
RDW: 13 % (ref 11.5–15.5)
WBC: 3 10*3/uL — ABNORMAL LOW (ref 4.0–10.5)

## 2015-04-17 LAB — HEMOGLOBIN A1C: Hgb A1c MFr Bld: 5.6 % (ref 4.6–6.5)

## 2015-04-17 MED ORDER — CYCLOBENZAPRINE HCL 10 MG PO TABS: 10.0000 mg | ORAL_TABLET | Freq: Every day | ORAL | Status: DC

## 2015-04-17 MED ORDER — IBUPROFEN 800 MG PO TABS: 800.0000 mg | ORAL_TABLET | Freq: Three times a day (TID) | ORAL | Status: DC | PRN

## 2015-04-17 NOTE — Progress Notes (Signed)
Pre visit review using our clinic review tool, if applicable. No additional management support is needed unless otherwise documented below in the visit note. 

## 2015-04-17 NOTE — Patient Instructions (Addendum)
Please call and schedule a mammogram.  There are several options in town, one good choice is:  [facility] ?  Address: [address], [place], [place] [address]  [phone]   I will refer you to Dr. Ethelene Halamos to discuss any options for your back pain Try the ibuprofen 800 mg - can also use tylenol- during the day and a flexeril at night. Remember the flexeril will make you feel sleepy!   I will be in touch with your labs asap
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
## 2015-04-18 ENCOUNTER — Encounter: Payer: Self-pay | Admitting: Family Medicine

## 2015-04-18 NOTE — Addendum Note (Signed)
Addended by: Abbe AmsterdamOPLAND, JESSICA C on: 04/18/2015 04:06 PM   Modules accepted: Orders

## 2015-04-20 ENCOUNTER — Encounter (HOSPITAL_COMMUNITY): Payer: Self-pay | Admitting: Nurse Practitioner

## 2015-04-20 ENCOUNTER — Emergency Department (HOSPITAL_COMMUNITY)
Admission: EM | Admit: 2015-04-20 | Discharge: 2015-04-20 | Disposition: A | Discharge status: Home / Self Care | Payer: BLUE CROSS/BLUE SHIELD | Source: Home / Self Care | Attending: Emergency Medicine | Admitting: Emergency Medicine

## 2015-04-20 DIAGNOSIS — M6248 Contracture of muscle, other site: Secondary | ICD-10-CM

## 2015-04-20 DIAGNOSIS — M62838 Other muscle spasm: Secondary | ICD-10-CM

## 2015-04-20 DIAGNOSIS — M62830 Muscle spasm of back: Secondary | ICD-10-CM | POA: Diagnosis not present

## 2015-04-20 DIAGNOSIS — M542 Cervicalgia: Secondary | ICD-10-CM | POA: Diagnosis not present

## 2015-04-20 DIAGNOSIS — M5442 Lumbago with sciatica, left side: Secondary | ICD-10-CM

## 2015-04-20 MED ORDER — DICLOFENAC SODIUM 75 MG PO TBEC: 75.0000 mg | DELAYED_RELEASE_TABLET | Freq: Two times a day (BID) | ORAL | Status: DC

## 2015-04-20 MED ORDER — GABAPENTIN 300 MG PO CAPS: ORAL_CAPSULE | ORAL | Status: DC

## 2015-04-20 MED ORDER — METAXALONE 800 MG PO TABS: 800.0000 mg | ORAL_TABLET | Freq: Three times a day (TID) | ORAL | Status: DC

## 2015-04-20 MED ORDER — HYDROCODONE-ACETAMINOPHEN 5-325 MG PO TABS: 2.0000 | ORAL_TABLET | ORAL | Status: DC | PRN

## 2015-04-20 MED ORDER — METHYLPREDNISOLONE SODIUM SUCC 125 MG IJ SOLR
INTRAMUSCULAR | Status: AC
  Filled 2015-04-20: qty 2

## 2015-04-20 MED ORDER — METHYLPREDNISOLONE SODIUM SUCC 125 MG IJ SOLR
125.0000 mg | Freq: Once | INTRAMUSCULAR | Status: AC
  Administered 2015-04-20: 125 mg via INTRAMUSCULAR

## 2015-04-20 MED ORDER — KETOROLAC TROMETHAMINE 60 MG/2ML IM SOLN
INTRAMUSCULAR | Status: AC
  Filled 2015-04-20: qty 2

## 2015-04-20 MED ORDER — KETOROLAC TROMETHAMINE 60 MG/2ML IM SOLN
60.0000 mg | Freq: Once | INTRAMUSCULAR | Status: AC
  Administered 2015-04-20: 60 mg via INTRAMUSCULAR
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
## 2015-04-20 NOTE — ED Notes (Signed)
Pt c/o 2 week history of increased neck pain radiating into bilateral shoulders and hands and lower back pain radiating down L leg. She describes pain as a burning, stabbing pain. She has been taking tramadol, flexeril, ibuprofen with no relief of pain. She has hx chronic neck and back pain and has had surgery in the past. She denies any new injuries. She is ambulatory, mae

## 2015-04-20 NOTE — Discharge Instructions (Signed)
Follow-up with your chiropractor. Deep tissue massage is also helpful. Try [facility] - they're located on [address]. They take walk-ins and are very affordable. Follow-up with your primary care physician and/or your neurosurgeon. Go to the ER for the signs and symptoms we discussed

## 2015-05-08 ENCOUNTER — Ambulatory Visit: Payer: BLUE CROSS/BLUE SHIELD

## 2015-05-08 ENCOUNTER — Other Ambulatory Visit (HOSPITAL_BASED_OUTPATIENT_CLINIC_OR_DEPARTMENT_OTHER): Payer: BLUE CROSS/BLUE SHIELD

## 2015-05-08 ENCOUNTER — Ambulatory Visit (INDEPENDENT_AMBULATORY_CARE_PROVIDER_SITE_OTHER): Payer: BLUE CROSS/BLUE SHIELD | Admitting: Family Medicine

## 2015-05-08 ENCOUNTER — Ambulatory Visit (HOSPITAL_BASED_OUTPATIENT_CLINIC_OR_DEPARTMENT_OTHER): Payer: BLUE CROSS/BLUE SHIELD | Admitting: Hematology & Oncology

## 2015-05-08 ENCOUNTER — Encounter: Payer: Self-pay | Admitting: Hematology & Oncology

## 2015-05-08 VITALS — BP 128/75 | HR 90 | Temp 98.7°F | Resp 18 | Ht 66.0 in | Wt 133.0 lb

## 2015-05-08 VITALS — BP 120/74 | HR 85 | Temp 98.1°F | Ht 66.5 in | Wt 133.8 lb

## 2015-05-08 DIAGNOSIS — D72819 Decreased white blood cell count, unspecified: Secondary | ICD-10-CM

## 2015-05-08 DIAGNOSIS — R21 Rash and other nonspecific skin eruption: Secondary | ICD-10-CM | POA: Diagnosis not present

## 2015-05-08 DIAGNOSIS — J302 Other seasonal allergic rhinitis: Secondary | ICD-10-CM | POA: Diagnosis not present

## 2015-05-08 LAB — CBC WITH DIFFERENTIAL (CANCER CENTER ONLY)
BASO#: 0 10*3/uL (ref 0.0–0.2)
BASO%: 0.3 % (ref 0.0–2.0)
EOS%: 1.6 % (ref 0.0–7.0)
Eosinophils Absolute: 0.1 10*3/uL (ref 0.0–0.5)
HCT: 33.5 % — ABNORMAL LOW (ref 34.8–46.6)
HGB: 11.7 g/dL (ref 11.6–15.9)
LYMPH#: 1.3 10*3/uL (ref 0.9–3.3)
LYMPH%: 35 % (ref 14.0–48.0)
MCH: 32.7 pg (ref 26.0–34.0)
MCHC: 34.9 g/dL (ref 32.0–36.0)
MCV: 94 fL (ref 81–101)
MONO#: 0.3 10*3/uL (ref 0.1–0.9)
MONO%: 8.6 % (ref 0.0–13.0)
NEUT#: 2 10*3/uL (ref 1.5–6.5)
NEUT%: 54.5 % (ref 39.6–80.0)
Platelets: 198 10*3/uL (ref 145–400)
RBC: 3.58 10*6/uL — ABNORMAL LOW (ref 3.70–5.32)
RDW: 12 % (ref 11.1–15.7)
WBC: 3.7 10*3/uL — ABNORMAL LOW (ref 3.9–10.0)

## 2015-05-08 LAB — CHCC SATELLITE - SMEAR

## 2015-05-08 MED ORDER — IPRATROPIUM BROMIDE 0.03 % NA SOLN: 2.0000 | Freq: Four times a day (QID) | NASAL | Status: DC

## 2015-05-08 MED ORDER — PREDNISONE 20 MG PO TABS: ORAL_TABLET | ORAL | Status: DC

## 2015-05-08 MED ORDER — METRONIDAZOLE 1 % EX GEL: Freq: Every day | CUTANEOUS | Status: DC

## 2015-05-08 NOTE — Patient Instructions (Signed)
Try some genteal dry eye gel for your eye symptoms We are going to use prednisone for 6 days for your allergy symptoms.  No voltaren / other NSAID meds while on prednisone Also try the atrovent nasal as needed for post- nasal drainage  If you are not feeling a lot better in a few days let me know

## 2015-05-08 NOTE — Progress Notes (Signed)
Pre visit review using our clinic review tool, if applicable. No additional management support is needed unless otherwise documented below in the visit note. 

## 2015-05-08 NOTE — Progress Notes (Signed)
Referral MD  Reason for Referral: Chronic leukopenia-ethnic associated leukopenia (EAL)  Chief Complaint  Patient presents with  . Other    New Patient  : My white cell count is low.  HPI: Meghan Travis is a very nice 50 year old African-American female. She is in great shape. She cleans houses.  She really has had no obvious health problems. She's had no surgery. There is no sickle cell in her family.  She is followed by Dr. Lorelei Pont. Lab work that has been done has shown her white cell count to be on the low side. This is somebody that is not new. Back 2 years ago, her white cell count was 3.2, a year ago white cell count was 3.5. Most recently, her CBC showed a white cell count of 3. Hemoglobin 13.5. Hematocrit 40.2. Platelet count 199,000. MCV was 95.  Meghan Travis has never had a problem with infections. She's had no fatigue or weakness. She's had no weight loss awakening. She is not a vegetarian area  Because of the leukopenia, she is calmly referred to the [place].  She does not smoke. She does not drink. She is a very strong faith.  There is no blood problems in the family. Pressure does have her mammograms done routinely.  There is no skin issues. She's had no joint problems. There is nothing to suggest a collagen vascular disease.  Overall, her performance status is ECOG 0.                 Past Medical History  Diagnosis Date  . Headache(784.0)     miagrine-  . Allergy   :  Past Surgical History  Procedure Laterality Date  . Cesarean section    . Anterior cervical decomp/discectomy fusion N/A 02/19/2013    Procedure: CERVICAL FOUR-FIVE,CERVICAL FIVE -SIX ANTERIOR CERVICAL DECOMPRESSION/FUSION;  Surgeon: Kristeen Miss, MD;  Location: [facility];  Service: Neurosurgery;  Laterality: N/A;  . Tubal ligation    . Tubal reveral     :   Current outpatient prescriptions:  .  diclofenac (VOLTAREN) 75 MG EC tablet, Take 1 tablet (75 mg  total) by mouth 2 (two) times daily. Take with food, Disp: 30 tablet, Rfl: 0 .  gabapentin (NEURONTIN) 300 MG capsule, 1 tab po at bedtime 1st day, 1 tablet bid second day, then 1 tablet tid, Disp: 30 capsule, Rfl: 0 .  HYDROcodone-acetaminophen (NORCO/VICODIN) 5-325 MG tablet, Take 2 tablets by mouth every 4 (four) hours as needed for moderate pain., Disp: 20 tablet, Rfl: 0 .  metaxalone (SKELAXIN) 800 MG tablet, Take 1 tablet (800 mg total) by mouth 3 (three) times daily., Disp: 21 tablet, Rfl: 0 .  [DISCONTINUED] montelukast (SINGULAIR) 10 MG tablet, Take 1 tablet (10 mg total) by mouth at bedtime., Disp: 30 tablet, Rfl: 5:  :  No Known Allergies:  Family History  Problem Relation Age of Onset  . Hyperlipidemia Mother   . Hypertension Mother   . Hyperlipidemia Father   . Hypertension Father   :  Social History   Social History  . Marital Status: Married    Spouse Name: N/A  . Number of Children: N/A  . Years of Education: N/A   Occupational History  . Not on file.   Social History Main Topics  . Smoking status: Never Smoker   . Smokeless tobacco: Not on file  . Alcohol Use: No  . Drug Use: No  . Sexual Activity: Not on file   Other Topics  Concern  . Not on file   Social History Narrative  :  Pertinent items are noted in HPI.  Exam: _0 @ Well-developed and well-nourished African-American female in no obvious distress vital signs are temperature of 98.7. Pulse 90. Blood pressure 128/75. Weight is 133 pounds. Head and neck exam shows no ocular or oral lesions. There are no palpable cervical or supraclavicular lymph nodes. Thyroid is nonpalpable. Lungs are clear bilaterally. Cardiac exam regular rate and rhythm with no murmurs, rubs or bruits. Abdomen is soft. She has good bowel sounds. There is no fluid wave. There is no palpable liver or spleen tip. Back exam shows no tenderness over the spine, ribs or hips. Extremities shows no clubbing, cyanosis or edema. She  has good range of motion of her joints. There is no joint swelling, erythema or warmth. Skin exam shows no rashes, ecchymoses or petechia. Neurological exam shows no focal neurological deficits.    Recent Labs  05/08/15 1445  WBC 3.7*  HGB 11.7  HCT 33.5*  PLT 198   No results for input(s): NA, K, CL, CO2, GLUCOSE, BUN, CREATININE, CALCIUM in the last 72 hours.  Blood smear review: Normochromic and normocytic papillation of red blood cells. She has no target cells. There is no nucleated red blood cells. I see no schistocytes. White cells are minimally decreased in number. She has good maturation of her white blood cells. I see no hypersegmented polys. She has no immature myeloid cells. She has no atypical lymphocytes. Platelets are adequate in number and size.  Pathology: None     Assessment and Plan:  Meghan Travis is a 50 year old African-American female with leukopenia. She's had this for a couple years. 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 This is associated with 25% of African-Americans. He does not indicate any tendency for a bone marrow disorder. Studies have been done which showed that this does not increase the risk of infection.  She does not bone marrow biopsy.  I do not see anything else that would cause the leukopenia. There is no evidence of collagen vascular disease. She has no evidence to support an account of myelodysplastic process.  At this point, I just do not think that we have to get her back to the office. We really are not adding to her medical care. She is very nice. We had excellent fellowship.  I spent about 45 minutes with her. I reviewed her labs with her. I reassured her.

## 2015-05-08 NOTE — Progress Notes (Signed)
[facility] [address], [address] [place], [place] [address] [phone] [phone]  Date:  05/08/2015   Name:  Meghan HaroldSharon Schaad   DOB:  05-27-1965   MRN:  [id number]  PCP:  No PCP Per Patient    Chief Complaint: Allergies   History of Present Illness:  Meghan HaroldSharon Lawry is a 50 y.o. very pleasant female patient who presents with the following:  Here today with complaint of possible sinusitis/ allergies.  She notes facial pressure, headache, drainage from her nose, itchy eyes, feeling nauseated.  She has noted sx for about 4-5 days.   She has noted some PND which she thinks is contributing to her nausea No fever No vomiting   She had slightly low wbc count and saw Dr. Myna HidalgoEnnever today- all is well, observation only  LMP 3/23 She is generally in good health Does not use contacts   She also has noted a dry, itchy rash about her mouth for the last couple of weeks  She has just tried "scrubbing it with dove soap" so far   Patient Active Problem List   Diagnosis Date Noted  . Cervical spondylosis with myelopathy 02/19/2013  . Herniated nucleus pulposus with myelopathy, cervical 01/13/2013    Past Medical History  Diagnosis Date  . Headache(784.0)     miagrine-  . Allergy     Past Surgical History  Procedure Laterality Date  . Cesarean section    . Anterior cervical decomp/discectomy fusion N/A 02/19/2013    Procedure: CERVICAL FOUR-FIVE,CERVICAL FIVE -SIX ANTERIOR CERVICAL DECOMPRESSION/FUSION;  Surgeon: Barnett AbuHenry Elsner, MD;  Location: [facility];  Service: Neurosurgery;  Laterality: N/A;  . Tubal ligation    . Tubal reveral       Social History  Substance Use Topics  . Smoking status: Never Smoker   . Smokeless tobacco: Not on file  . Alcohol Use: No    Family History  Problem Relation Age of Onset  . Hyperlipidemia Mother   . Hypertension Mother   . Hyperlipidemia Father   . Hypertension Father     No Known  Allergies  Medication list has been reviewed and updated.  Current Outpatient Prescriptions on File Prior to Visit  Medication Sig Dispense Refill  . diclofenac (VOLTAREN) 75 MG EC tablet Take 1 tablet (75 mg total) by mouth 2 (two) times daily. Take with food 30 tablet 0  . gabapentin (NEURONTIN) 300 MG capsule 1 tab po at bedtime 1st day, 1 tablet bid second day, then 1 tablet tid 30 capsule 0  . HYDROcodone-acetaminophen (NORCO/VICODIN) 5-325 MG tablet Take 2 tablets by mouth every 4 (four) hours as needed for moderate pain. 20 tablet 0  . metaxalone (SKELAXIN) 800 MG tablet Take 1 tablet (800 mg total) by mouth 3 (three) times daily. 21 tablet 0  . [DISCONTINUED] montelukast (SINGULAIR) 10 MG tablet Take 1 tablet (10 mg total) by mouth at bedtime. 30 tablet 5   No current facility-administered medications on file prior to visit.    Review of Systems:  As per HPI- otherwise negative.   Physical Examination: Filed Vitals:   05/08/15 1651  BP: 120/74  Pulse: 85  Temp: 98.1 F (36.7 C)   Filed Vitals:   05/08/15 1651  Height: 5' 6.5" (1.689 m)  Weight: 133 lb 12.8 oz (60.691 kg)   Body mass index is 21.27 kg/(m^2). Ideal Body Weight: Weight in (lb) to have BMI = 25: 156.9  GEN: WDWN, NAD,  Non-toxic, A & O x 3, looks well, thin build HEENT: Atraumatic, Normocephalic. Neck supple. No masses, No LAD.  Bilateral TM wnl, oropharynx normal.  PEERL,EOMI.  Nasal cavity is inflamed  Pencil eraser sized macules of dry and scaly skin around her mouth Ears and Nose: No external deformity. CV: RRR, No M/G/R. No JVD. No thrill. No extra heart sounds. PULM: CTA B, no wheezes, crackles, rhonchi. No retractions. No resp. distress. No accessory muscle use. EXTR: No c/c/e NEURO Normal gait.  PSYCH: Normally interactive. Conversant. Not depressed or anxious appearing.  Calm demeanor.    Assessment and Plan: Facial rash - Plan: metroNIDAZOLE (METROGEL) 1 % gel  Other seasonal allergic  rhinitis - Plan: predniSONE (DELTASONE) 20 MG tablet, ipratropium (ATROVENT) 0.03 % nasal spray  Peri-oral dermatitis Trial of metrogel as needed, let me know if not better Suspect that her current upper resp sx are due to allergies She has used prednisone in the past with success- will use a short course of this and also atrovent, OTC dry eye gel If sx persist next week consider abx   Signed Abbe Amsterdam, MD

## 2015-06-05 ENCOUNTER — Emergency Department (HOSPITAL_COMMUNITY): Payer: BLUE CROSS/BLUE SHIELD

## 2015-06-05 ENCOUNTER — Emergency Department (HOSPITAL_COMMUNITY)
Admission: EM | Admit: 2015-06-05 | Discharge: 2015-06-05 | Disposition: A | Discharge status: Home / Self Care | Payer: BLUE CROSS/BLUE SHIELD | Attending: Emergency Medicine | Admitting: Emergency Medicine

## 2015-06-05 ENCOUNTER — Encounter (HOSPITAL_COMMUNITY): Payer: Self-pay | Admitting: Emergency Medicine

## 2015-06-05 DIAGNOSIS — M549 Dorsalgia, unspecified: Secondary | ICD-10-CM

## 2015-06-05 DIAGNOSIS — R51 Headache: Secondary | ICD-10-CM | POA: Diagnosis not present

## 2015-06-05 DIAGNOSIS — S199XXA Unspecified injury of neck, initial encounter: Secondary | ICD-10-CM | POA: Insufficient documentation

## 2015-06-05 DIAGNOSIS — M545 Low back pain: Secondary | ICD-10-CM | POA: Diagnosis not present

## 2015-06-05 DIAGNOSIS — S0990XA Unspecified injury of head, initial encounter: Secondary | ICD-10-CM | POA: Diagnosis not present

## 2015-06-05 DIAGNOSIS — G43909 Migraine, unspecified, not intractable, without status migrainosus: Secondary | ICD-10-CM | POA: Diagnosis not present

## 2015-06-05 DIAGNOSIS — Z9851 Tubal ligation status: Secondary | ICD-10-CM | POA: Insufficient documentation

## 2015-06-05 DIAGNOSIS — S79912A Unspecified injury of left hip, initial encounter: Secondary | ICD-10-CM | POA: Insufficient documentation

## 2015-06-05 DIAGNOSIS — S299XXA Unspecified injury of thorax, initial encounter: Secondary | ICD-10-CM | POA: Diagnosis not present

## 2015-06-05 DIAGNOSIS — Z791 Long term (current) use of non-steroidal anti-inflammatories (NSAID): Secondary | ICD-10-CM | POA: Insufficient documentation

## 2015-06-05 DIAGNOSIS — S3992XA Unspecified injury of lower back, initial encounter: Secondary | ICD-10-CM | POA: Insufficient documentation

## 2015-06-05 DIAGNOSIS — Z3202 Encounter for pregnancy test, result negative: Secondary | ICD-10-CM | POA: Insufficient documentation

## 2015-06-05 DIAGNOSIS — M546 Pain in thoracic spine: Secondary | ICD-10-CM | POA: Diagnosis not present

## 2015-06-05 DIAGNOSIS — M25551 Pain in right hip: Secondary | ICD-10-CM

## 2015-06-05 DIAGNOSIS — N949 Unspecified condition associated with female genital organs and menstrual cycle: Secondary | ICD-10-CM

## 2015-06-05 DIAGNOSIS — S29002A Unspecified injury of muscle and tendon of back wall of thorax, initial encounter: Secondary | ICD-10-CM | POA: Insufficient documentation

## 2015-06-05 DIAGNOSIS — Y9389 Activity, other specified: Secondary | ICD-10-CM | POA: Insufficient documentation

## 2015-06-05 DIAGNOSIS — S3991XA Unspecified injury of abdomen, initial encounter: Secondary | ICD-10-CM | POA: Diagnosis not present

## 2015-06-05 DIAGNOSIS — S79911A Unspecified injury of right hip, initial encounter: Secondary | ICD-10-CM | POA: Diagnosis not present

## 2015-06-05 DIAGNOSIS — M25552 Pain in left hip: Secondary | ICD-10-CM

## 2015-06-05 DIAGNOSIS — N838 Other noninflammatory disorders of ovary, fallopian tube and broad ligament: Secondary | ICD-10-CM | POA: Diagnosis not present

## 2015-06-05 DIAGNOSIS — R079 Chest pain, unspecified: Secondary | ICD-10-CM | POA: Diagnosis not present

## 2015-06-05 DIAGNOSIS — Z79899 Other long term (current) drug therapy: Secondary | ICD-10-CM | POA: Diagnosis not present

## 2015-06-05 DIAGNOSIS — Y998 Other external cause status: Secondary | ICD-10-CM | POA: Diagnosis not present

## 2015-06-05 DIAGNOSIS — Y92410 Unspecified street and highway as the place of occurrence of the external cause: Secondary | ICD-10-CM | POA: Insufficient documentation

## 2015-06-05 DIAGNOSIS — M542 Cervicalgia: Secondary | ICD-10-CM

## 2015-06-05 LAB — URINALYSIS, ROUTINE W REFLEX MICROSCOPIC
Bilirubin Urine: NEGATIVE
Glucose, UA: NEGATIVE mg/dL
Hgb urine dipstick: NEGATIVE
Ketones, ur: NEGATIVE mg/dL
Leukocytes, UA: NEGATIVE
Nitrite: POSITIVE — AB
Protein, ur: NEGATIVE mg/dL
Specific Gravity, Urine: 1.021 (ref 1.005–1.030)
pH: 7 (ref 5.0–8.0)

## 2015-06-05 LAB — URINE MICROSCOPIC-ADD ON

## 2015-06-05 LAB — PREGNANCY, URINE: Preg Test, Ur: NEGATIVE

## 2015-06-05 MED ORDER — IOPAMIDOL (ISOVUE-300) INJECTION 61%
INTRAVENOUS | Status: AC
  Filled 2015-06-05: qty 100

## 2015-06-05 MED ORDER — HYDROCODONE-ACETAMINOPHEN 5-325 MG PO TABS: 2.0000 | ORAL_TABLET | ORAL | Status: DC | PRN

## 2015-06-05 MED ORDER — HYDROCODONE-ACETAMINOPHEN 5-325 MG PO TABS: 1.0000 | ORAL_TABLET | Freq: Once | ORAL | Status: DC

## 2015-06-05 MED ORDER — CYCLOBENZAPRINE HCL 10 MG PO TABS: 5.0000 mg | ORAL_TABLET | Freq: Once | ORAL | Status: DC

## 2015-06-05 MED ORDER — KETOROLAC TROMETHAMINE 60 MG/2ML IM SOLN
60.0000 mg | Freq: Once | INTRAMUSCULAR | Status: AC
  Administered 2015-06-05: 60 mg via INTRAMUSCULAR
  Filled 2015-06-05: qty 2

## 2015-06-05 MED ORDER — CYCLOBENZAPRINE HCL 10 MG PO TABS: 10.0000 mg | ORAL_TABLET | Freq: Three times a day (TID) | ORAL | Status: DC | PRN
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
## 2015-06-05 NOTE — ED Notes (Signed)
c-collar applied in triage

## 2015-06-06 MED ORDER — IOPAMIDOL (ISOVUE-300) INJECTION 61%
100.0000 mL | Freq: Once | INTRAVENOUS | Status: AC | PRN
  Administered 2015-06-05: 100 mL via INTRAVENOUS

## 2015-06-09 DIAGNOSIS — S7002XA Contusion of left hip, initial encounter: Secondary | ICD-10-CM | POA: Diagnosis not present

## 2015-06-12 ENCOUNTER — Ambulatory Visit (INDEPENDENT_AMBULATORY_CARE_PROVIDER_SITE_OTHER): Payer: BLUE CROSS/BLUE SHIELD | Admitting: Family Medicine

## 2015-06-12 VITALS — BP 115/81 | HR 79 | Temp 98.2°F | Ht 66.5 in | Wt 132.2 lb

## 2015-06-12 DIAGNOSIS — J3089 Other allergic rhinitis: Secondary | ICD-10-CM

## 2015-06-12 DIAGNOSIS — N83202 Unspecified ovarian cyst, left side: Secondary | ICD-10-CM

## 2015-06-12 DIAGNOSIS — T148 Other injury of unspecified body region: Secondary | ICD-10-CM

## 2015-06-12 DIAGNOSIS — T07XXXA Unspecified multiple injuries, initial encounter: Secondary | ICD-10-CM

## 2015-06-12 MED ORDER — MONTELUKAST SODIUM 10 MG PO TABS: 10.0000 mg | ORAL_TABLET | Freq: Every day | ORAL | Status: DC

## 2015-06-12 NOTE — Progress Notes (Signed)
Pre visit review using our clinic tool,if applicable. No additional management support is needed unless otherwise documented below in the visit note.  
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
## 2015-06-13 ENCOUNTER — Other Ambulatory Visit: Payer: Self-pay | Admitting: Family Medicine

## 2015-06-13 DIAGNOSIS — N9489 Other specified conditions associated with female genital organs and menstrual cycle: Secondary | ICD-10-CM

## 2015-06-16 ENCOUNTER — Telehealth: Payer: Self-pay | Admitting: *Deleted

## 2015-06-16 NOTE — Telephone Encounter (Signed)
Pt signed ROI received via fax from [facility]. Requested records faxed to them successfully at [phone]. Request sent for scanning. JG//CMA

## 2015-06-24 ENCOUNTER — Ambulatory Visit
Admission: RE | Admit: 2015-06-24 | Discharge: 2015-06-24 | Disposition: A | Discharge status: Home / Self Care | Payer: BLUE CROSS/BLUE SHIELD | Source: Ambulatory Visit | Attending: Family Medicine | Admitting: Family Medicine

## 2015-06-24 DIAGNOSIS — N83202 Unspecified ovarian cyst, left side: Secondary | ICD-10-CM | POA: Diagnosis not present

## 2015-06-24 DIAGNOSIS — N9489 Other specified conditions associated with female genital organs and menstrual cycle: Secondary | ICD-10-CM

## 2015-06-24 DIAGNOSIS — N83201 Unspecified ovarian cyst, right side: Secondary | ICD-10-CM | POA: Diagnosis not present

## 2015-06-26 ENCOUNTER — Encounter: Payer: Self-pay | Admitting: Family Medicine

## 2015-08-12 DIAGNOSIS — Z1231 Encounter for screening mammogram for malignant neoplasm of breast: Secondary | ICD-10-CM | POA: Diagnosis not present

## 2015-08-19 ENCOUNTER — Telehealth: Payer: Self-pay | Admitting: General Practice

## 2015-08-19 NOTE — Telephone Encounter (Signed)
Caller name:[facility] Relationship to patient: Can be reached:[phone] Ref# [id number] Fax # [phone] Pharmacy:  Reason for call:Need order for [facility] Breast Diagnostic Mammogram, they are faxing request over

## 2015-08-20 NOTE — Telephone Encounter (Signed)
Done

## 2015-08-25 NOTE — Telephone Encounter (Signed)
Order received.  Order forwarded to Dr. Patsy Lager for review and signature.

## 2015-08-28 ENCOUNTER — Telehealth: Payer: Self-pay | Admitting: General Practice

## 2015-08-28 NOTE — Telephone Encounter (Signed)
Relation to OB:SJGG Call back number:[phone] Pharmacy:  Reason for call:  Patient requesting all most recent imaging results please send to [facility] [address], [place], [place] [address] [phone]

## 2015-08-29 ENCOUNTER — Telehealth: Payer: Self-pay

## 2015-08-29 DIAGNOSIS — Z1239 Encounter for other screening for malignant neoplasm of breast: Secondary | ICD-10-CM

## 2015-08-29 NOTE — Telephone Encounter (Signed)
Sent request for referral order to Dr. Patsy Lager. Patient states they are sending her copies of last MMGs so she no longer needs them from [place].

## 2015-08-30 NOTE — Telephone Encounter (Signed)
done

## 2015-09-03 ENCOUNTER — Other Ambulatory Visit: Payer: Self-pay | Admitting: Family Medicine

## 2015-09-03 DIAGNOSIS — Z1231 Encounter for screening mammogram for malignant neoplasm of breast: Secondary | ICD-10-CM

## 2016-02-25 ENCOUNTER — Ambulatory Visit: Payer: BLUE CROSS/BLUE SHIELD | Admitting: Family Medicine

## 2016-03-01 ENCOUNTER — Ambulatory Visit (INDEPENDENT_AMBULATORY_CARE_PROVIDER_SITE_OTHER): Payer: BLUE CROSS/BLUE SHIELD | Admitting: Family Medicine

## 2016-03-01 VITALS — BP 123/75 | HR 90 | Temp 98.8°F | Ht 67.0 in | Wt 132.0 lb

## 2016-03-01 DIAGNOSIS — G8929 Other chronic pain: Secondary | ICD-10-CM

## 2016-03-01 DIAGNOSIS — J0111 Acute recurrent frontal sinusitis: Secondary | ICD-10-CM | POA: Diagnosis not present

## 2016-03-01 DIAGNOSIS — M549 Dorsalgia, unspecified: Secondary | ICD-10-CM | POA: Diagnosis not present

## 2016-03-01 MED ORDER — DICLOFENAC SODIUM 75 MG PO TBEC
75.0000 mg | DELAYED_RELEASE_TABLET | Freq: Two times a day (BID) | ORAL | 5 refills | Status: DC
Start: 2016-03-01 — End: 2016-03-01

## 2016-03-01 MED ORDER — METAXALONE 800 MG PO TABS: 800.0000 mg | ORAL_TABLET | Freq: Three times a day (TID) | ORAL | 5 refills | Status: DC

## 2016-03-01 MED ORDER — DICLOFENAC SODIUM 75 MG PO TBEC: 75.0000 mg | DELAYED_RELEASE_TABLET | Freq: Two times a day (BID) | ORAL | 5 refills | Status: DC

## 2016-03-01 MED ORDER — AMOXICILLIN 500 MG PO CAPS: 1000.0000 mg | ORAL_CAPSULE | Freq: Two times a day (BID) | ORAL | 0 refills | Status: DC

## 2016-03-01 NOTE — Progress Notes (Signed)
Pre visit review using our clinic review tool, if applicable. No additional management support is needed unless otherwise documented below in the visit note. 
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
## 2016-03-01 NOTE — Patient Instructions (Addendum)
Use the amoxicillin as directed for your sinus infection.  Let me know if you are not feeling better soon- Sooner if worse.  I also refilled your voltaren and skelaxin to use as needed for your back and neck pain Take care!   JC

## 2016-07-23 ENCOUNTER — Encounter: Payer: Self-pay | Admitting: Family Medicine

## 2016-07-23 ENCOUNTER — Ambulatory Visit (INDEPENDENT_AMBULATORY_CARE_PROVIDER_SITE_OTHER): Payer: BLUE CROSS/BLUE SHIELD | Admitting: Family Medicine

## 2016-07-23 VITALS — BP 100/80 | HR 68 | Temp 98.1°F | Ht 67.0 in | Wt 127.0 lb

## 2016-07-23 DIAGNOSIS — R350 Frequency of micturition: Secondary | ICD-10-CM

## 2016-07-23 DIAGNOSIS — J0110 Acute frontal sinusitis, unspecified: Secondary | ICD-10-CM

## 2016-07-23 LAB — POC URINALSYSI DIPSTICK (AUTOMATED)
Bilirubin, UA: NEGATIVE
Blood, UA: NEGATIVE
Glucose, UA: NEGATIVE
Ketones, UA: NEGATIVE
Leukocytes, UA: NEGATIVE
Nitrite, UA: NEGATIVE
Protein, UA: NEGATIVE
Spec Grav, UA: 1.025 (ref 1.010–1.025)
Urobilinogen, UA: 0.2 E.U./dL
pH, UA: 6.5 (ref 5.0–8.0)

## 2016-07-23 MED ORDER — FLUCONAZOLE 150 MG PO TABS
150.0000 mg | ORAL_TABLET | Freq: Once | ORAL | 0 refills | Status: AC
Start: 2016-07-23 — End: 2016-07-23

## 2016-07-23 MED ORDER — AMOXICILLIN-POT CLAVULANATE 875-125 MG PO TABS: 1.0000 | ORAL_TABLET | Freq: Two times a day (BID) | ORAL | 0 refills | Status: DC

## 2016-07-23 NOTE — Patient Instructions (Signed)
We will let you know the results of your urine culture next week if it is abnormal.   Consider branched-chain amino acids for supplementation during workout. Consider drinking your protein shake 20-30 min after your workout. I don't have good evidence for this recommendation.  Let us know if you need anything.  Continue to push fluids, practice good hand hygiene, and cover your mouth if you cough.  If you start having fevers, shaking or shortness of breath, seek immediate care.

## 2016-07-23 NOTE — Addendum Note (Signed)
Addended by: Verdie ShireBAYNES, Bulmaro Feagans M on: 07/23/2016 02:41 PM   Modules accepted: Orders

## 2016-07-23 NOTE — Progress Notes (Signed)
Chief Complaint  Patient presents with  . Urinary Tract Infection    low back pain,freq. urination-x 3 weeks  . Sinus Problem    pressure,off balance,h/a-x 2 weeks    Meghan Travis is a 51 y.o. female here for possible UTI.  Duration: 3 weeks Symptoms: urinary frequency and bladder spasm Denies: hematuria, urinary hesitancy, urinary retention, fever and dysuria, vaginal discharge Hx of recurrent UTI? No Denies new sexual partners.  Also having a sinus infection, pain over eyebrows b/l. Started 3 weeks ago. Some congestion and dry eyes. No sick contacts. Denies cough, rhinorrhea, ear pain/drainage, fevers, rigors, ST, muscle aches.   ROS:  Constitutional: denies fever GU: As noted in HPI Abd: Denies constipation or abdominal pain  Past Medical History:  Diagnosis Date  . Allergy   . Headache(784.0)    miagrine-   Family History  Problem Relation Age of Onset  . Hyperlipidemia Mother   . Hypertension Mother   . Hyperlipidemia Father   . Hypertension Father    Social History   Social History  . Marital status: Married   Social History Main Topics  . Smoking status: Never Smoker  . Alcohol use No  . Drug use: No    BP 100/80 (BP Location: Left Arm, Patient Position: Sitting, Cuff Size: Normal)   Pulse 68   Temp 98.1 F (36.7 C) (Oral)   Ht 5\' 7"  (1.702 m)   Wt 127 lb (57.6 kg)   LMP 06/28/2016 (Approximate)   SpO2 99%   BMI 19.89 kg/m  General: Awake, alert, appears stated age HEENT: MMM, pharynx pink w/o exudate, +TTP over frontal sinus b/l, nares patent w/o discharge, ears neg b/l Heart: RRR, no murmurs Lungs: CTAB, normal respiratory effort, no accessory muscle usage Abd: BS+, soft, NT, ND, no masses or organomegaly MSK: No CVA tenderness, neg Lloyd's sign Psych: Age appropriate judgment and insight  Acute non-recurrent frontal sinusitis - Plan: amoxicillin-clavulanate (AUGMENTIN) 875-125 MG tablet, fluconazole (DIFLUCAN) 150 MG tablet  Urinary  frequency  Orders as above. Hopefully will cover for both issues if she has a UTI despite neg UA. Push fluids, practice good hand hygiene.  F/u prn. The patient voiced understanding and agreement to the plan.  Jilda Rocheicholas Paul [place], DO 07/23/16 2:21 PM

## 2016-07-25 LAB — URINE CULTURE: Organism ID, Bacteria: NO GROWTH

## 2017-01-18 NOTE — Progress Notes (Signed)
[facility] [address] [place], [place] [address] [phone] [phone]  Date:  01/19/2017   Name:  Meghan HaroldSharon Travis   DOB:  10-04-1965   MRN:  [id number]  PCP:  Pearline Cablesopland, Jessica C, MD    Chief Complaint: No chief complaint on file.   History of Present Illness:  Meghan Travis is a 51 y.o. very pleasant female patient who presents with the following:  I last saw her in January of this year for a sick visit:  Requesting refill for Voltaren & Skelaxin. 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 She would like a refill to have on hand  LMP in March of this year- likley menopausal Patient Active Problem List   Diagnosis Date Noted  . Cervical spondylosis with myelopathy 02/19/2013  . Herniated nucleus pulposus with myelopathy, cervical 01/13/2013    Past Medical History:  Diagnosis Date  . Allergy   . PIRJJOAC(166.0Headache(784.0)    miagrine-    Past Surgical History:  Procedure Laterality Date  . ANTERIOR CERVICAL DECOMP/DISCECTOMY FUSION N/A 02/19/2013   Procedure: CERVICAL FOUR-FIVE,CERVICAL FIVE -SIX ANTERIOR CERVICAL DECOMPRESSION/FUSION;  Surgeon: Barnett AbuHenry Elsner, MD;  Location: [facility];  Service: Neurosurgery;  Laterality: N/A;  . CESAREAN SECTION    . TUBAL LIGATION    . tubal reveral       Social History   Tobacco  Use  . Smoking status: Never Smoker  . Smokeless tobacco: Never Used  Substance Use Topics  . Alcohol use: No    Alcohol/week: 0.0 oz  . Drug use: No    Family History  Problem Relation Age of Onset  . Hyperlipidemia Mother   . Hypertension Mother   . Hyperlipidemia Father   . Hypertension Father     No Known Allergies  Medication list has been reviewed and updated.  Current Outpatient Medications on File Prior to Visit  Medication Sig Dispense Refill  . amoxicillin-clavulanate (AUGMENTIN) 875-125 MG tablet Take 1 tablet by mouth 2 (two) times daily. 20 tablet 0  . cyclobenzaprine (FLEXERIL) 10 MG tablet Take 1 tablet (10 mg total) by mouth 3 (three) times daily as needed for muscle spasms. 15 tablet 0  . diclofenac (VOLTAREN) 75 MG EC tablet Take 1 tablet (75 mg total) by mouth 2 (two) times daily. Use as needed for pain. Take with food 60 tablet 5  . gabapentin (NEURONTIN) 300 MG capsule 1 tab po at bedtime 1st day, 1 tablet bid second day, then 1 tablet tid (Patient taking differently: Take 300 mg by mouth daily as needed (pain). ) 30 capsule 0  . HYDROcodone-acetaminophen (NORCO/VICODIN) 5-325 MG tablet Take 2 tablets by mouth every 4 (four) hours as needed. 10 tablet 0  . ibuprofen (ADVIL,MOTRIN) 800  MG tablet Take 800 mg by mouth every 8 (eight) hours as needed for mild pain.    Marland Kitchen ipratropium (ATROVENT) 0.03 % nasal spray Place 2 sprays into the nose 4 (four) times daily. 30 mL 6  . loratadine (CLARITIN) 10 MG tablet Take 10 mg by mouth daily.    . metaxalone (SKELAXIN) 800 MG tablet Take 1 tablet (800 mg total) by mouth 3 (three) times daily. Use as needed for pain 60 tablet 5  . montelukast (SINGULAIR) 10 MG tablet Take 1 tablet (10 mg total) by mouth at bedtime. 30 tablet 11   No current facility-administered medications on file prior to visit.     Review of Systems:  As per HPI- otherwise negative. No fever or chills No CP or SOB   Physical Examination: Vitals:    01/19/17 1242  BP: 116/77  Pulse: 100  Resp: 18  Temp: 98.6 F (37 C)  SpO2: 99%   Vitals:   01/19/17 1242  Weight: 127 lb 9.6 oz (57.9 kg)  Height: 5\' 7"  (1.702 m)   Body mass index is 19.98 kg/m. Ideal Body Weight: Weight in (lb) to have BMI = 25: 159.3  GEN: WDWN, NAD, Non-toxic, A & O x 3, slim build, looks well HEENT: Atraumatic, Normocephalic. Neck supple. No masses, No LAD.  Bilateral TM wnl, oropharynx normal.  PEERL,EOMI.   Ears and Nose: No external deformity. CV: RRR, No M/G/R. No JVD. No thrill. No extra heart sounds. PULM: CTA B, no wheezes, crackles, rhonchi. No retractions. No resp. distress. No accessory muscle use. ABD: S, NT, ND, +BS. No rebound. No HSM. EXTR: No c/c/e NEURO Normal gait.  PSYCH: Normally interactive. Conversant. Not depressed or anxious appearing.  Calm demeanor.  Tenderness of the trapezius muscles, more so on the left  Right supraclavicular- she notes a firm area but I think this is muscular and not a node No appreciable cervical nodes to my exam   Assessment and Plan: Chronic neck pain - Plan: cyclobenzaprine (FLEXERIL) 10 MG tablet, DISCONTINUED: cyclobenzaprine (FLEXERIL) 10 MG tablet  Other allergic rhinitis - Plan: montelukast (SINGULAIR) 10 MG tablet  Cough - Plan: DG Chest 2 View  Worsening of her chronic neck pain, and she is concerned about nodes in her neck Refilled her flexeril Refilled singulair CXR and mammo today- she has never had a mammogram so this is important given concern about supraclavicular node    Signed Abbe Amsterdam, MD  Received her CXR. mammo and gave her a call-  Dg Chest 2 View  Result Date: 01/19/2017 CLINICAL DATA:  Right-sided in anterior lower neck swelling 2-3 weeks. Nonsmoker. No other complaints. EXAM: CHEST  2 VIEW COMPARISON:  PA and lateral chest x-ray of May 16, 2014 FINDINGS: The lungs are well-expanded. There is no focal infiltrate. There is no pleural effusion. The heart and  pulmonary vascularity are normal. The mediastinum is normal in width. The trachea is midline. The bony thorax exhibits no acute abnormality. IMPRESSION: There is no active cardiopulmonary disease.  The trachea is midline. Electronically Signed   By: David  [place] M.D.   On: 01/19/2017 13:16   Mm Screening Breast Tomo Bilateral  Result Date: 01/19/2017 CLINICAL DATA:  Screening. EXAM: 2D DIGITAL SCREENING BILATERAL MAMMOGRAM WITH CAD AND ADJUNCT TOMO COMPARISON:  None. ACR Breast Density Category c: The breast tissue is heterogeneously dense, which may obscure small masses FINDINGS: There are no findings suspicious for malignancy. Images were processed with CAD. IMPRESSION: No mammographic evidence of malignancy.  A result letter of this screening mammogram will be mailed directly to the patient. RECOMMENDATION: Screening mammogram in one year. (Code:SM-B-01Y) BI-RADS CATEGORY  1: Negative. Electronically Signed   By: Amie Portlandavid  Ormond M.D.   On: 01/19/2017 16:10

## 2017-01-19 ENCOUNTER — Encounter: Payer: Self-pay | Admitting: Family Medicine

## 2017-01-19 ENCOUNTER — Other Ambulatory Visit: Payer: Self-pay | Admitting: Family Medicine

## 2017-01-19 ENCOUNTER — Ambulatory Visit (HOSPITAL_BASED_OUTPATIENT_CLINIC_OR_DEPARTMENT_OTHER)
Admission: RE | Admit: 2017-01-19 | Discharge: 2017-01-19 | Disposition: A | Discharge status: Home / Self Care | Payer: BLUE CROSS/BLUE SHIELD | Source: Ambulatory Visit | Attending: Family Medicine | Admitting: Family Medicine

## 2017-01-19 ENCOUNTER — Ambulatory Visit: Payer: BLUE CROSS/BLUE SHIELD | Admitting: Family Medicine

## 2017-01-19 ENCOUNTER — Encounter (HOSPITAL_BASED_OUTPATIENT_CLINIC_OR_DEPARTMENT_OTHER): Payer: Self-pay

## 2017-01-19 VITALS — BP 116/77 | HR 100 | Temp 98.6°F | Resp 18 | Ht 67.0 in | Wt 127.6 lb

## 2017-01-19 DIAGNOSIS — R059 Cough, unspecified: Secondary | ICD-10-CM

## 2017-01-19 DIAGNOSIS — R05 Cough: Secondary | ICD-10-CM | POA: Diagnosis not present

## 2017-01-19 DIAGNOSIS — G8929 Other chronic pain: Secondary | ICD-10-CM

## 2017-01-19 DIAGNOSIS — Z1231 Encounter for screening mammogram for malignant neoplasm of breast: Secondary | ICD-10-CM | POA: Insufficient documentation

## 2017-01-19 DIAGNOSIS — R221 Localized swelling, mass and lump, neck: Secondary | ICD-10-CM | POA: Diagnosis not present

## 2017-01-19 DIAGNOSIS — J3089 Other allergic rhinitis: Secondary | ICD-10-CM | POA: Diagnosis not present

## 2017-01-19 DIAGNOSIS — M542 Cervicalgia: Secondary | ICD-10-CM

## 2017-01-19 MED ORDER — CYCLOBENZAPRINE HCL 10 MG PO TABS: 10.0000 mg | ORAL_TABLET | Freq: Every day | ORAL | 1 refills | Status: DC

## 2017-01-19 MED ORDER — MONTELUKAST SODIUM 10 MG PO TABS: 10.0000 mg | ORAL_TABLET | Freq: Every day | ORAL | 3 refills | Status: DC

## 2017-01-19 NOTE — Patient Instructions (Signed)
We will get a chest x-ray for you today- I'll be in touch with your result asap Please also schedule a mammogram today!  I refilled your cyclobenzaprine for you to use as needed- use this or skelaxin, not both

## 2017-05-09 ENCOUNTER — Other Ambulatory Visit: Payer: Self-pay | Admitting: Family Medicine

## 2017-05-09 DIAGNOSIS — G8929 Other chronic pain: Secondary | ICD-10-CM

## 2017-05-09 DIAGNOSIS — M549 Dorsalgia, unspecified: Principal | ICD-10-CM

## 2017-05-12 NOTE — Telephone Encounter (Signed)
Received refill request for diclofenac (VOLTAREN). Last office visit 01/19/17 and last 03/01/16.

## 2017-07-20 NOTE — Progress Notes (Signed)
[facility] [place] [address], [address] [place], [place] [address] [phone] [phone]  Date:  07/21/2017   Name:  Meghan HaroldSharon Travis   DOB:  December 12, 1965   MRN:  [id number]  PCP:  Pearline Cablesopland, Jessica C, MD    Chief Complaint: Ear Pain (c/o bilateral ear pain, pt states that left ear has an echo. Also having neck and facial pain. )   History of Present Illness:  Meghan Travis is a 52 y.o. very pleasant female patient who presents with the following:  History of spine disease Here today with concern of an ear problem - first noted sx about 4 days ago  She had first noted an echo in her left ear.   She called to make an appt- since then she has noted some pain in her left neck and some pain in her right ear as well Her throat is sore, and she notes a sinus headache No cough  No fever noted No vomiting or diarrhea No ear drainage   Last seen here in December: She is here today with worsening of her chronic neck and shoulder pain She did have a cervical spine operation back in 2015 She also feels like she has a tender node in her right neck for a couple of weeks, and also right supraclavicular area She has not had a mammogram- she is willing to do this however She is taking voltaren and skelaxin but sparingly - she also uses flexeril on rare occasion when her pain is worst. She would like a refill to have on hand   Patient Active Problem List   Diagnosis Date Noted  . Cervical spondylosis with myelopathy 02/19/2013  . Herniated nucleus pulposus with myelopathy, cervical 01/13/2013    Past Medical History:  Diagnosis Date  . Allergy   . EXBMWUXL(244.0Headache(784.0)    miagrine-    Past Surgical History:  Procedure Laterality Date  . ANTERIOR CERVICAL DECOMP/DISCECTOMY FUSION N/A 02/19/2013   Procedure: CERVICAL FOUR-FIVE,CERVICAL FIVE -SIX ANTERIOR CERVICAL DECOMPRESSION/FUSION;  Surgeon: Barnett AbuHenry Elsner, MD;  Location: [facility];  Service: Neurosurgery;   Laterality: N/A;  . CESAREAN SECTION    . TUBAL LIGATION    . tubal reveral       Social History   Tobacco Use  . Smoking status: Never Smoker  . Smokeless tobacco: Never Used  Substance Use Topics  . Alcohol use: No    Alcohol/week: 0.0 oz  . Drug use: No    Family History  Problem Relation Age of Onset  . Hyperlipidemia Mother   . Hypertension Mother   . Hyperlipidemia Father   . Hypertension Father     No Known Allergies  Medication list has been reviewed and updated.  Current Outpatient Medications on File Prior to Visit  Medication Sig Dispense Refill  . cyclobenzaprine (FLEXERIL) 10 MG tablet Take 1 tablet (10 mg total) by mouth at bedtime. 30 tablet 1  . diclofenac (VOLTAREN) 75 MG EC tablet TAKE ONE TABLET BY MOUTH TWICE DAILY WITH FOOD AS NEEDED FOR PAIN 60 tablet 5  . gabapentin (NEURONTIN) 300 MG capsule 1 tab po at bedtime 1st day, 1 tablet bid second day, then 1 tablet tid (Patient taking differently: Take 300 mg by mouth daily as needed (pain). ) 30 capsule 0  . ibuprofen (ADVIL,MOTRIN) 800 MG tablet Take 800 mg by mouth every 8 (eight) hours as needed for mild pain.    Marland Kitchen. ipratropium (ATROVENT) 0.03 % nasal  spray Place 2 sprays into the nose 4 (four) times daily. 30 mL 6  . loratadine (CLARITIN) 10 MG tablet Take 10 mg by mouth daily.    . metaxalone (SKELAXIN) 800 MG tablet Take 1 tablet (800 mg total) by mouth 3 (three) times daily. Use as needed for pain 60 tablet 5  . montelukast (SINGULAIR) 10 MG tablet Take 1 tablet (10 mg total) by mouth at bedtime. 90 tablet 3   No current facility-administered medications on file prior to visit.     Review of Systems:  As per HPI- otherwise negative. Pt worries that her appetite is not good and she can't gain weight  Wt Readings from Last 3 Encounters:  07/21/17 124 lb 9.6 oz (56.5 kg)  01/19/17 127 lb 9.6 oz (57.9 kg)  07/23/16 127 lb (57.6 kg)   However her weight is stable, she has always been petite.  Reassured her that her BMI is normal    Physical Examination: Vitals:   07/21/17 1532  BP: 122/78  Pulse: 93  Temp: 98.7 F (37.1 C)  SpO2: 97%   Vitals:   07/21/17 1532  Weight: 124 lb 9.6 oz (56.5 kg)  Height: 5\' 6"  (1.676 m)   Body mass index is 20.11 kg/m. Ideal Body Weight: Weight in (lb) to have BMI = 25: 154.6  GEN: WDWN, NAD, Non-toxic, A & O x 3, slim build, looks well  HEENT: Atraumatic, Normocephalic. Neck supple. No masses, No LAD.  Oropharynx normal.  PEERL,EOMI.  Left TM is bulging with clear fluid Right shows clear effusion Nasal cavity is inflamed  Pt is very tender in her lateral neck muscles, notes muscle spasm I cannot palpate any enlarged notes. Neck ROM is normal  Ears and Nose: No external deformity. CV: RRR, No M/G/R. No JVD. No thrill. No extra heart sounds. PULM: CTA B, no wheezes, crackles, rhonchi. No retractions. No resp. distress. No accessory muscle use. EXTR: No c/c/e NEURO Normal gait.  PSYCH: Normally interactive. Conversant. Not depressed or anxious appearing.  Calm demeanor.    Assessment and Plan: Acute non-recurrent frontal sinusitis - Plan: amoxicillin (AMOXIL) 500 MG capsule, predniSONE (DELTASONE) 20 MG tablet  Chronic back pain, unspecified back location, unspecified back pain laterality - Plan: diclofenac (VOLTAREN) 75 MG EC tablet  Chronic neck pain - Plan: cyclobenzaprine (FLEXERIL) 10 MG tablet  Other seasonal allergic rhinitis - Plan: ipratropium (ATROVENT) 0.03 % nasal spray  Suspect her ear sx are caused by sinus infection/ ETD rx for amox and prednisone Refilled her voltaren and flexeril atrovent nasal prn  Asked her to let me know if not feeling better in the next few days- Sooner if worse.     Signed Abbe Amsterdam, MD

## 2017-07-21 ENCOUNTER — Ambulatory Visit: Payer: BLUE CROSS/BLUE SHIELD | Admitting: Family Medicine

## 2017-07-21 ENCOUNTER — Encounter: Payer: Self-pay | Admitting: Family Medicine

## 2017-07-21 VITALS — BP 122/78 | HR 93 | Temp 98.7°F | Ht 66.0 in | Wt 124.6 lb

## 2017-07-21 DIAGNOSIS — M542 Cervicalgia: Secondary | ICD-10-CM | POA: Diagnosis not present

## 2017-07-21 DIAGNOSIS — G8929 Other chronic pain: Secondary | ICD-10-CM

## 2017-07-21 DIAGNOSIS — J0110 Acute frontal sinusitis, unspecified: Secondary | ICD-10-CM | POA: Diagnosis not present

## 2017-07-21 DIAGNOSIS — J302 Other seasonal allergic rhinitis: Secondary | ICD-10-CM

## 2017-07-21 DIAGNOSIS — M549 Dorsalgia, unspecified: Secondary | ICD-10-CM | POA: Diagnosis not present

## 2017-07-21 MED ORDER — IPRATROPIUM BROMIDE 0.03 % NA SOLN: 2.0000 | Freq: Four times a day (QID) | NASAL | 6 refills | Status: DC

## 2017-07-21 MED ORDER — AMOXICILLIN 500 MG PO CAPS: 1000.0000 mg | ORAL_CAPSULE | Freq: Two times a day (BID) | ORAL | 0 refills | Status: DC

## 2017-07-21 MED ORDER — PREDNISONE 20 MG PO TABS: ORAL_TABLET | ORAL | 0 refills | Status: DC

## 2017-07-21 MED ORDER — CYCLOBENZAPRINE HCL 10 MG PO TABS: 10.0000 mg | ORAL_TABLET | Freq: Every day | ORAL | 1 refills | Status: DC

## 2017-07-21 MED ORDER — DICLOFENAC SODIUM 75 MG PO TBEC: DELAYED_RELEASE_TABLET | ORAL | 9 refills | Status: DC

## 2017-07-21 NOTE — Patient Instructions (Signed)
Good to see you today-  I suspect that your ear symptoms are due to a sinus infection We are going to treat you with amoxicillin for 10 days and also prednisone for 6 days Don't take the diclofenac just while you are on the prednisone   Please let me know if not feeling better in the next few days- Sooner if worse.

## 2018-08-02 NOTE — Progress Notes (Addendum)
[facility] [place], [place], [place] [address] [phone]  Date:  08/07/2018   Name:  Meghan Travis   DOB:  [birth year]   MRN:  [id number]  PCP:  Darreld Mclean, MD    Chief Complaint: Medication Refill and Facial Swelling (since saturday, eye brow, very painful)   History of Present Illness:  Meghan Travis is a 53 y.o. very pleasant female patient who presents with the following:  Last visit together about a year ago for allergies- we rx atrovent nasal at that time  No recent CPE - she is overdue for some services  Pap: this is due, will complete today Colon: will order cologurad  Labs: due for complete panel, she is not fasting today  mammo 2018- will order for her   She has had some issues with her right eye being dry and uncomfortable.  This has been present for at least a year.  She does use an over-the-counter gel drops at times. More issues with her ears being congested and puffiness of her eyes, especially around her right eye. Vision is not acutely changed - she wears glasses and thinks they may need to be updated   Also, she has noted pains in her bilateral legs that bothers her after she goes to bed at night - has noted this for about one month She was doing some exercise therapy that helped- however this has been closed due to pandemic  She was using diclofenac but has run out She was also using muscle relaxer but ran out of these as well She did start back on some gabapentin but is just using this as needed.  I have not refilled this since 2017  Patient Active Problem List   Diagnosis Date Noted  . Cervical spondylosis with myelopathy 02/19/2013  . Herniated nucleus pulposus with myelopathy, cervical 01/13/2013    Past Medical History:  Diagnosis Date  . Allergy   . DDUKGURK(270.6)    miagrine-    Past Surgical History:  Procedure Laterality Date  . ANTERIOR CERVICAL  DECOMP/DISCECTOMY FUSION N/A 02/19/2013   Procedure: CERVICAL FOUR-FIVE,CERVICAL FIVE -SIX ANTERIOR CERVICAL DECOMPRESSION/FUSION;  Surgeon: Kristeen Miss, MD;  Location: [facility];  Service: Neurosurgery;  Laterality: N/A;  . CESAREAN SECTION    . TUBAL LIGATION    . tubal reveral       Social History   Tobacco Use  . Smoking status: Never Smoker  . Smokeless tobacco: Never Used  Substance Use Topics  . Alcohol use: No    Alcohol/week: 0.0 standard drinks  . Drug use: No    Family History  Problem Relation Age of Onset  . Hyperlipidemia Mother   . Hypertension Mother   . Hyperlipidemia Father   . Hypertension Father     No Known Allergies  Medication list has been reviewed and updated.  Current Outpatient Medications on File Prior to Visit  Medication Sig Dispense Refill  . cyclobenzaprine (FLEXERIL) 10 MG tablet Take 1 tablet (10 mg total) by mouth at bedtime. Use as needed for muscle spasm 30 tablet 1  . diclofenac (VOLTAREN) 75 MG EC tablet TAKE ONE TABLET BY MOUTH TWICE DAILY WITH FOOD AS NEEDED FOR PAIN 60 tablet 9  . gabapentin (NEURONTIN) 300 MG capsule 1 tab po at bedtime 1st day, 1 tablet bid second day, then 1 tablet tid (Patient taking differently: Take 300 mg by mouth daily as needed (pain). )  30 capsule 0  . ibuprofen (ADVIL,MOTRIN) 800 MG tablet Take 800 mg by mouth every 8 (eight) hours as needed for mild pain.    Marland Kitchen. ipratropium (ATROVENT) 0.03 % nasal spray Place 2 sprays into the nose 4 (four) times daily. 30 mL 6  . loratadine (CLARITIN) 10 MG tablet Take 10 mg by mouth daily.    . metaxalone (SKELAXIN) 800 MG tablet Take 1 tablet (800 mg total) by mouth 3 (three) times daily. Use as needed for pain 60 tablet 5  . montelukast (SINGULAIR) 10 MG tablet Take 1 tablet (10 mg total) by mouth at bedtime. 90 tablet 3   No current facility-administered medications on file prior to visit.     Review of Systems:  As per HPI- otherwise negative. No fever or  chills, no chest pain or shortness of breath  She has been working during the pandemic, she has a cleaning business  Physical Examination: Vitals:   08/07/18 1016  BP: 112/80  Pulse: 81  Resp: 16  Temp: 98.2 F (36.8 C)  SpO2: 99%   Vitals:   08/07/18 1016  Weight: 127 lb (57.6 kg)  Height: 5\' 6"  (1.676 m)   Body mass index is 20.5 kg/m. Ideal Body Weight: Weight in (lb) to have BMI = 25: 154.6  GEN: WDWN, NAD, Non-toxic, A & O x 3, normal weight, looks well HEENT: Atraumatic, Normocephalic. Neck supple. No masses, No LAD.  Bilateral TM wnl, oropharynx normal.  PEERL,EOMI. mild puffiness around the right eye, no pain with extraocular movement movement however.  Tenderness above right cheek which may indicate sinusitis Ears and Nose: No external deformity. CV: RRR, No M/G/R. No JVD. No thrill. No extra heart sounds. PULM: CTA B, no wheezes, crackles, rhonchi. No retractions. No resp. distress. No accessory muscle use. ABD: S, NT, ND, +BS. No rebound. No HSM. EXTR: No c/c/e Calves are soft and nontender NEURO Normal gait.  PSYCH: Normally interactive. Conversant. Not depressed or anxious appearing.  Calm demeanor.  Pelvic: normal, no vaginal lesions or discharge. Uterus normal, no CMT, no adnexal tendereness or masses     Assessment and Plan:   ICD-10-CM   1. Screening for breast cancer  Z12.39 MM 3D SCREEN BREAST BILATERAL  2. Screening for colon cancer  Z12.11   3. Eye dryness  H04.129 Ambulatory referral to Ophthalmology    amoxicillin-clavulanate (AUGMENTIN) 875-125 MG tablet  4. Chronic back pain, unspecified back location, unspecified back pain laterality  M54.9 diclofenac (VOLTAREN) 75 MG EC tablet   G89.29   5. Screening for cervical cancer  Z12.4 Cytology - PAP  6. Screening for diabetes mellitus  Z13.1 Comprehensive metabolic panel    Hemoglobin A1c  7. Screening for hyperlipidemia  Z13.220 Lipid panel  8. Screening for deficiency anemia  Z13.0 CBC  9.  Bilateral leg pain  M79.604 tiZANidine (ZANAFLEX) 2 MG tablet   M79.605 CK   Following up today for routine visit Pap completed Ordered Cologuard Ordered mammogram She has noted bilateral calf pains at bedtime for at least a month.  Labs pending as above, gave prescription for tizanidine muscle relaxer-patient requested this as a friend of hers has used it Puffiness and irritation of right eye.  The eye itself appears normal, but she has some puffiness in the cheek area.  Will treat with a course of Augmentin, referred to ophthalmology Asked her to let me know if this is worsening or any other concerns  Follow-up: No follow-ups on file.  Meds ordered  this encounter  Medications  . diclofenac (VOLTAREN) 75 MG EC tablet    Sig: TAKE ONE TABLET BY MOUTH TWICE DAILY WITH FOOD AS NEEDED FOR PAIN    Dispense:  60 tablet    Refill:  3    Please consider 90 day supplies to promote better adherence  . amoxicillin-clavulanate (AUGMENTIN) 875-125 MG tablet    Sig: Take 1 tablet by mouth 2 (two) times daily.    Dispense:  20 tablet    Refill:  0  . tiZANidine (ZANAFLEX) 2 MG tablet    Sig: Take 1 tablet (2 mg total) by mouth every 6 (six) hours as needed for muscle spasms.    Dispense:  40 tablet    Refill:  1   Orders Placed This Encounter  Procedures  . MM 3D SCREEN BREAST BILATERAL  . CBC  . Comprehensive metabolic panel  . Hemoglobin A1c  . Lipid panel  . CK  . Ambulatory referral to Ophthalmology    @SIGN @    Signed Abbe AmsterdamJessica Copland, MD  Received her labs 7/8- letter to pt  Results for orders placed or performed in visit on 08/07/18  CBC  Result Value Ref Range   WBC 3.7 (L) 4.0 - 10.5 K/uL   RBC 3.97 3.87 - 5.11 Mil/uL   Platelets 214.0 150.0 - 400.0 K/uL   Hemoglobin 12.6 12.0 - 15.0 g/dL   HCT 40.937.5 81.136.0 - 91.446.0 %   MCV 94.5 78.0 - 100.0 fl   MCHC 33.5 30.0 - 36.0 g/dL   RDW 78.213.2 95.611.5 - 21.315.5 %  Comprehensive metabolic panel  Result Value Ref Range   Sodium 141  135 - 145 mEq/L   Potassium 4.3 3.5 - 5.1 mEq/L   Chloride 106 96 - 112 mEq/L   CO2 28 19 - 32 mEq/L   Glucose, Bld 72 70 - 99 mg/dL   BUN 18 6 - 23 mg/dL   Creatinine, Ser 0.860.73 0.40 - 1.20 mg/dL   Total Bilirubin 0.5 0.2 - 1.2 mg/dL   Alkaline Phosphatase 109 39 - 117 U/L   AST 15 0 - 37 U/L   ALT 9 0 - 35 U/L   Total Protein 7.2 6.0 - 8.3 g/dL   Albumin 4.6 3.5 - 5.2 g/dL   Calcium 9.5 8.4 - 57.810.5 mg/dL   GFR 469.62101.19 >95.28>60.00 mL/min  Hemoglobin A1c  Result Value Ref Range   Hgb A1c MFr Bld 5.6 4.6 - 6.5 %  Lipid panel  Result Value Ref Range   Cholesterol 176 0 - 200 mg/dL   Triglycerides 41.342.0 0.0 - 149.0 mg/dL   HDL 24.4068.60 >10.27>39.00 mg/dL   VLDL 8.4 0.0 - 25.340.0 mg/dL   LDL Cholesterol 99 0 - 99 mg/dL   Total CHOL/HDL Ratio 3    NonHDL 107.48   CK  Result Value Ref Range   Total CK 76 7 - 177 U/L  Cytology - PAP  Result Value Ref Range   Adequacy      Satisfactory for evaluation  endocervical/transformation zone component ABSENT.   Diagnosis      NEGATIVE FOR INTRAEPITHELIAL LESIONS OR MALIGNANCY.   HPV NOT DETECTED    Material Submitted CervicoVaginal Pap [ThinPrep Imaged]    Letter to pt

## 2018-08-07 ENCOUNTER — Other Ambulatory Visit: Payer: Self-pay

## 2018-08-07 ENCOUNTER — Ambulatory Visit: Payer: BLUE CROSS/BLUE SHIELD | Admitting: Family Medicine

## 2018-08-07 ENCOUNTER — Encounter: Payer: Self-pay | Admitting: Family Medicine

## 2018-08-07 ENCOUNTER — Other Ambulatory Visit (HOSPITAL_COMMUNITY)
Admission: RE | Admit: 2018-08-07 | Discharge: 2018-08-07 | Disposition: A | Discharge status: Home / Self Care | Payer: BLUE CROSS/BLUE SHIELD | Source: Ambulatory Visit | Attending: Family Medicine | Admitting: Family Medicine

## 2018-08-07 VITALS — BP 112/80 | HR 81 | Temp 98.2°F | Resp 16 | Ht 66.0 in | Wt 127.0 lb

## 2018-08-07 DIAGNOSIS — M549 Dorsalgia, unspecified: Secondary | ICD-10-CM

## 2018-08-07 DIAGNOSIS — H04129 Dry eye syndrome of unspecified lacrimal gland: Secondary | ICD-10-CM

## 2018-08-07 DIAGNOSIS — M79605 Pain in left leg: Secondary | ICD-10-CM | POA: Diagnosis not present

## 2018-08-07 DIAGNOSIS — Z1239 Encounter for other screening for malignant neoplasm of breast: Secondary | ICD-10-CM | POA: Diagnosis not present

## 2018-08-07 DIAGNOSIS — M79604 Pain in right leg: Secondary | ICD-10-CM

## 2018-08-07 DIAGNOSIS — Z1211 Encounter for screening for malignant neoplasm of colon: Secondary | ICD-10-CM | POA: Diagnosis not present

## 2018-08-07 DIAGNOSIS — Z13220 Encounter for screening for lipoid disorders: Secondary | ICD-10-CM

## 2018-08-07 DIAGNOSIS — G8929 Other chronic pain: Secondary | ICD-10-CM

## 2018-08-07 DIAGNOSIS — Z131 Encounter for screening for diabetes mellitus: Secondary | ICD-10-CM

## 2018-08-07 DIAGNOSIS — Z130 Encounter for screening for diseases of the blood and blood-forming organs and certain disorders involving the immune mechanism: Secondary | ICD-10-CM | POA: Diagnosis not present

## 2018-08-07 DIAGNOSIS — Z124 Encounter for screening for malignant neoplasm of cervix: Secondary | ICD-10-CM | POA: Insufficient documentation

## 2018-08-07 LAB — LIPID PANEL
Cholesterol: 176 mg/dL (ref 0–200)
HDL: 68.6 mg/dL (ref >39.00)
LDL Cholesterol: 99 mg/dL (ref 0–99)
NonHDL: 107.48
Total CHOL/HDL Ratio: 3
Triglycerides: 42 mg/dL (ref 0.0–149.0)
VLDL: 8.4 mg/dL (ref 0.0–40.0)

## 2018-08-07 LAB — CBC
HCT: 37.5 % (ref 36.0–46.0)
Hemoglobin: 12.6 g/dL (ref 12.0–15.0)
MCHC: 33.5 g/dL (ref 30.0–36.0)
MCV: 94.5 fl (ref 78.0–100.0)
Platelets: 214 10*3/uL (ref 150.0–400.0)
RBC: 3.97 Mil/uL (ref 3.87–5.11)
RDW: 13.2 % (ref 11.5–15.5)
WBC: 3.7 10*3/uL — ABNORMAL LOW (ref 4.0–10.5)

## 2018-08-07 LAB — COMPREHENSIVE METABOLIC PANEL
ALT: 9 U/L (ref 0–35)
AST: 15 U/L (ref 0–37)
Albumin: 4.6 g/dL (ref 3.5–5.2)
Alkaline Phosphatase: 109 U/L (ref 39–117)
BUN: 18 mg/dL (ref 6–23)
CO2: 28 mEq/L (ref 19–32)
Calcium: 9.5 mg/dL (ref 8.4–10.5)
Chloride: 106 mEq/L (ref 96–112)
Creatinine, Ser: 0.73 mg/dL (ref 0.40–1.20)
GFR: 101.19 mL/min (ref >60.00)
Glucose, Bld: 72 mg/dL (ref 70–99)
Potassium: 4.3 mEq/L (ref 3.5–5.1)
Sodium: 141 mEq/L (ref 135–145)
Total Bilirubin: 0.5 mg/dL (ref 0.2–1.2)
Total Protein: 7.2 g/dL (ref 6.0–8.3)

## 2018-08-07 LAB — HEMOGLOBIN A1C: Hgb A1c MFr Bld: 5.6 % (ref 4.6–6.5)

## 2018-08-07 LAB — CK: Total CK: 76 U/L (ref 7–177)

## 2018-08-07 MED ORDER — DICLOFENAC SODIUM 75 MG PO TBEC: DELAYED_RELEASE_TABLET | ORAL | 3 refills | Status: DC

## 2018-08-07 MED ORDER — AMOXICILLIN-POT CLAVULANATE 875-125 MG PO TABS: 1.0000 | ORAL_TABLET | Freq: Two times a day (BID) | ORAL | 0 refills | Status: DC

## 2018-08-07 MED ORDER — TIZANIDINE HCL 2 MG PO TABS: 2.0000 mg | ORAL_TABLET | Freq: Four times a day (QID) | ORAL | 1 refills | Status: DC | PRN

## 2018-08-07 NOTE — Patient Instructions (Signed)
Great to see you again today- I will be in touch with your labs asap We did your pap today, will set you up for a mammogram You should get your Cologuard kit at your hoome soon Will look for any cause for your leg pain I also refilled diclofenac to use as needed for pain, and also tizanidine for you to try as a muscle relaxer We will refer you to see an eye doctor, and I gave you an rx for augmentin antibiotic for any sinus infection that is developing  Please let me know if your eye symptoms are getting worse or changing

## 2018-08-09 LAB — CYTOLOGY - PAP
Adequacy: ABSENT
Diagnosis: NEGATIVE
HPV: NOT DETECTED

## 2018-08-15 DIAGNOSIS — Z1211 Encounter for screening for malignant neoplasm of colon: Secondary | ICD-10-CM | POA: Diagnosis not present

## 2018-08-15 DIAGNOSIS — Z1212 Encounter for screening for malignant neoplasm of rectum: Secondary | ICD-10-CM | POA: Diagnosis not present

## 2018-08-23 ENCOUNTER — Encounter: Payer: Self-pay | Admitting: Family Medicine

## 2018-08-23 LAB — COLOGUARD: Cologuard: NEGATIVE

## 2018-08-29 DIAGNOSIS — H04123 Dry eye syndrome of bilateral lacrimal glands: Secondary | ICD-10-CM | POA: Diagnosis not present

## 2018-08-29 DIAGNOSIS — H5203 Hypermetropia, bilateral: Secondary | ICD-10-CM | POA: Diagnosis not present

## 2018-08-29 DIAGNOSIS — H524 Presbyopia: Secondary | ICD-10-CM | POA: Diagnosis not present

## 2018-08-29 DIAGNOSIS — H52203 Unspecified astigmatism, bilateral: Secondary | ICD-10-CM | POA: Diagnosis not present

## 2019-03-19 ENCOUNTER — Encounter (HOSPITAL_BASED_OUTPATIENT_CLINIC_OR_DEPARTMENT_OTHER): Payer: Self-pay

## 2019-03-19 ENCOUNTER — Other Ambulatory Visit: Payer: Self-pay

## 2019-03-19 ENCOUNTER — Ambulatory Visit (HOSPITAL_BASED_OUTPATIENT_CLINIC_OR_DEPARTMENT_OTHER)
Admission: RE | Admit: 2019-03-19 | Discharge: 2019-03-19 | Disposition: A | Discharge status: Home / Self Care | Payer: BLUE CROSS/BLUE SHIELD | Source: Ambulatory Visit | Attending: Family Medicine | Admitting: Family Medicine

## 2019-03-19 DIAGNOSIS — Z1239 Encounter for other screening for malignant neoplasm of breast: Secondary | ICD-10-CM

## 2019-03-19 DIAGNOSIS — Z1231 Encounter for screening mammogram for malignant neoplasm of breast: Secondary | ICD-10-CM | POA: Insufficient documentation

## 2019-04-26 ENCOUNTER — Ambulatory Visit: Payer: BLUE CROSS/BLUE SHIELD | Attending: Internal Medicine

## 2019-04-26 DIAGNOSIS — Z23 Encounter for immunization: Secondary | ICD-10-CM

## 2019-04-26 NOTE — Progress Notes (Signed)
   Covid-19 Vaccination Clinic  Name:  Meghan Travis    MRN: [id number] DOB: Mar 31, 1965  04/26/2019  [name] was observed post Covid-19 immunization for 15 minutes without incident. She was provided with Vaccine Information Sheet and instruction to access the V-Safe system.   [name] was instructed to call 911 with any severe reactions post vaccine: Marland Kitchen Difficulty breathing  . Swelling of face and throat  . A fast heartbeat  . A bad rash all over body  . Dizziness and weakness   Immunizations Administered    Name Date Dose VIS Date Route   Moderna COVID-19 Vaccine 04/26/2019 12:54 PM 0.5 mL 01/02/2019 Intramuscular   Manufacturer: Moderna   Lot: [id number]   NDC: 73532-992-42

## 2019-05-24 ENCOUNTER — Ambulatory Visit: Payer: BLUE CROSS/BLUE SHIELD | Attending: Internal Medicine

## 2019-05-24 DIAGNOSIS — Z23 Encounter for immunization: Secondary | ICD-10-CM

## 2019-05-24 NOTE — Progress Notes (Signed)
   Covid-19 Vaccination Clinic  Name:  Meghan Travis    MRN: [id number] DOB: 1965/11/15  05/24/2019  [name] was observed post Covid-19 immunization for 15 minutes without incident. She was provided with Vaccine Information Sheet and instruction to access the V-Safe system.   [name] was instructed to call 911 with any severe reactions post vaccine: Marland Kitchen Difficulty breathing  . Swelling of face and throat  . A fast heartbeat  . A bad rash all over body  . Dizziness and weakness   Immunizations Administered    Name Date Dose VIS Date Route   Moderna COVID-19 Vaccine 05/24/2019 12:36 PM 0.5 mL 01/2019 Intramuscular   Manufacturer: Moderna   Lot: [id number]   NDC: 95072-257-50

## 2019-07-27 NOTE — Progress Notes (Addendum)
[facility] [place], [place], [place] [address] [phone]  Date:  07/30/2019   Name:  Meghan Travis   DOB:  1965/10/10   MRN:  [id number]  PCP:  Darreld Mclean, MD    Chief Complaint: Abdominal Pain (left upper abdominal pain, nausea, no vomiting, has made diet changes -slight improvement), Weight Loss, and Medication Refill (tizanadine and nasal spray)   History of Present Illness:  Meghan Travis is a 54 y.o. very pleasant female patient who presents with the following:  Patient with history of migraine headaches, cervical spine disease Here today with concern of stomach pain-in the epigastric and left upper quadrants.  She has noted this for a month or so.  She changed her diet - cut down on fried/ fatty foods which did seem to help Eating heavier foods might make it worse Her own mother has gallstones/gallbladder colic No vomiting No bowel changes No jaundice or change in appearance of urine Last seen by myself about 1 year ago for physical  Mammogram up-to-date Pap is up-to-date Colon cancer screening up-to-date Covid series is complete Most recent labs in July 2020  Married to [place], she has never been a smoker  She has lost a few lbs from changing her diet and being more active  Wt Readings from Last 3 Encounters:  07/30/19 123 lb 6.4 oz (56 kg)  08/07/18 127 lb (57.6 kg)  07/21/17 124 lb 9.6 oz (56.5 kg)   She works in a physically active job; 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  She gets quite a bit of exercise on a daily basis  Patient Active Problem List   Diagnosis Date Noted  . Cervical spondylosis with myelopathy 02/19/2013  . Herniated nucleus pulposus with myelopathy, cervical 01/13/2013    Past Medical History:  Diagnosis Date  . Allergy   . HGDJMEQA(834.1)    miagrine-    Past Surgical History:  Procedure Laterality Date  . ANTERIOR CERVICAL DECOMP/DISCECTOMY  FUSION N/A 02/19/2013   Procedure: CERVICAL FOUR-FIVE,CERVICAL FIVE -SIX ANTERIOR CERVICAL DECOMPRESSION/FUSION;  Surgeon: Kristeen Miss, MD;  Location: [facility];  Service: Neurosurgery;  Laterality: N/A;  . CESAREAN SECTION    . TUBAL LIGATION    . tubal reveral       Social History   Tobacco Use  . Smoking status: Never Smoker  . Smokeless tobacco: Never Used  Substance Use Topics  . Alcohol use: No    Alcohol/week: 0.0 standard drinks  . Drug use: No    Family History  Problem Relation Age of Onset  . Hyperlipidemia Mother   . Hypertension Mother   . Hyperlipidemia Father   . Hypertension Father     No Known Allergies  Medication list has been reviewed and updated.  Current Outpatient Medications on File Prior to Visit  Medication Sig Dispense Refill  . cyclobenzaprine (FLEXERIL) 10 MG tablet Take 1 tablet (10 mg total) by mouth at bedtime. Use as needed for muscle spasm 30 tablet 1  . diclofenac (VOLTAREN) 75 MG EC tablet TAKE ONE TABLET BY MOUTH TWICE DAILY WITH FOOD AS NEEDED FOR PAIN 60 tablet 3  . gabapentin (NEURONTIN) 300 MG capsule 1 tab po at bedtime 1st day, 1 tablet bid second day, then 1 tablet tid (Patient taking differently: Take 300 mg by mouth daily as needed (pain). ) 30 capsule 0  . ibuprofen (ADVIL,MOTRIN) 800 MG tablet Take 800 mg by  mouth every 8 (eight) hours as needed for mild pain.    Marland Kitchen ipratropium (ATROVENT) 0.03 % nasal spray Place 2 sprays into the nose 4 (four) times daily. 30 mL 6  . loratadine (CLARITIN) 10 MG tablet Take 10 mg by mouth daily.    . metaxalone (SKELAXIN) 800 MG tablet Take 1 tablet (800 mg total) by mouth 3 (three) times daily. Use as needed for pain 60 tablet 5  . tiZANidine (ZANAFLEX) 2 MG tablet Take 1 tablet (2 mg total) by mouth every 6 (six) hours as needed for muscle spasms. 40 tablet 1   No current facility-administered medications on file prior to visit.    Review of Systems:  As per HPI- otherwise  negative.   Physical Examination: Vitals:   07/30/19 0949  BP: 118/70  Pulse: 89  Resp: 16  SpO2: 98%   Vitals:   07/30/19 0949  Weight: 123 lb 6.4 oz (56 kg)  Height: 5\' 6"  (1.676 m)   Body mass index is 19.92 kg/m. Ideal Body Weight: Weight in (lb) to have BMI = 25: 154.6  GEN: no acute distress.  Slim built, has lost just a couple of pounds HEENT: Atraumatic, Normocephalic.  No scleral icterus, Bilateral TM wnl, oropharynx normal.  PEERL,EOMI.   Ears and Nose: No external deformity. CV: RRR, No M/G/R. No JVD. No thrill. No extra heart sounds. PULM: CTA B, no wheezes, crackles, rhonchi. No retractions. No resp. distress. No accessory muscle use. ABD: S,  ND, +BS. No rebound. No HSM.  Minimal tenderness is noted at the epigastrium and left upper quadrant.  Negative Murphy sign EXTR: No c/c/e PSYCH: Normally interactive. Conversant.    Assessment and Plan: Screening for diabetes mellitus - Plan: Hemoglobin A1c  Screening for hyperlipidemia - Plan: Lipid panel  Screening for deficiency anemia - Plan: CBC, Comprehensive metabolic panel  Epigastric pain - Plan: Amylase, Lipase  Weight loss - Plan: TSH  Postprandial abdominal pain in left upper quadrant - Plan: Abdomen Limited RUQ  Generally healthy woman who is seen today with concern of abdominal pain which can be postprandial.  She is also lost a couple of pounds, which may be due to diet change and increased exercise.  However, she is concerned about her gallbladder or pancreatic issue.  We will plan to get labs as above, will schedule right upper quadrant ultrasound ASAP.  If these are noncontributory, plan to proceed to CT scan. I have asked her to let me know if her symptoms are worsening or change in the meantime Will plan further follow- up pending labs.  This visit occurred during the SARS-CoV-2 public health emergency.  Safety protocols were in place, including screening questions prior to the visit, additional  usage of staff PPE, and extensive cleaning of exam room while observing appropriate contact time as indicated for disinfecting solutions.    Signed [place], MD  Received her labs as below- gave her a call to go over results. Nothing acute but leukopenia is more pronounced than baseline She has seen hematology for this in the past and it was thought to be physiologic  Letter to pt as well   Results for orders placed or performed in visit on 07/30/19  CBC  Result Value Ref Range   WBC 2.5 (L) 4.0 - 10.5 K/uL   RBC 3.90 3.87 - 5.11 Mil/uL   Platelets 187.0 150 - 400 K/uL   Hemoglobin 12.4 12.0 - 15.0 g/dL   HCT 08/01/19 36 - 46 %  MCV 95.4 78.0 - 100.0 fl   MCHC 33.4 30.0 - 36.0 g/dL   RDW 00.7 12.1 - 97.5 %  Comprehensive metabolic panel  Result Value Ref Range   Sodium 141 135 - 145 mEq/L   Potassium 5.3 No hemolysis seen (H) 3.5 - 5.1 mEq/L   Chloride 106 96 - 112 mEq/L   CO2 28 19 - 32 mEq/L   Glucose, Bld 98 70 - 99 mg/dL   BUN 26 (H) 6 - 23 mg/dL   Creatinine, Ser 8.83 0.40 - 1.20 mg/dL   Total Bilirubin 0.4 0.2 - 1.2 mg/dL   Alkaline Phosphatase 109 39 - 117 U/L   AST 21 0 - 37 U/L   ALT 17 0 - 35 U/L   Total Protein 7.1 6.0 - 8.3 g/dL   Albumin 4.6 3.5 - 5.2 g/dL   GFR 254.98 >26.41 mL/min   Calcium 10.1 8.4 - 10.5 mg/dL  Hemoglobin R8X  Result Value Ref Range   Hgb A1c MFr Bld 5.9 4.6 - 6.5 %  Lipid panel  Result Value Ref Range   Cholesterol 153 0 - 200 mg/dL   Triglycerides 09.4 0 - 149 mg/dL   HDL 07.68 >08.81 mg/dL   VLDL 6.4 0.0 - 10.3 mg/dL   LDL Cholesterol 84 0 - 99 mg/dL   Total CHOL/HDL Ratio 2    NonHDL 90.36   TSH  Result Value Ref Range   TSH 0.81 0.35 - 4.50 uIU/mL  Amylase  Result Value Ref Range   Amylase 85 27 - 131 U/L  Lipase  Result Value Ref Range   Lipase 62.0 (H) 11 - 59 U/L

## 2019-07-27 NOTE — Patient Instructions (Addendum)
It was good to see you again today! I will be in touch with your labs asap and we will set you up for an ultrasound of your liver and gallbladder.  If this does not show anything will proceed to a CT scan.  Please let me know if any changes or worsening of your symptoms in the meantime

## 2019-07-30 ENCOUNTER — Encounter: Payer: Self-pay | Admitting: Family Medicine

## 2019-07-30 ENCOUNTER — Ambulatory Visit: Payer: BLUE CROSS/BLUE SHIELD | Admitting: Family Medicine

## 2019-07-30 ENCOUNTER — Other Ambulatory Visit: Payer: Self-pay

## 2019-07-30 VITALS — BP 118/70 | HR 89 | Resp 16 | Ht 66.0 in | Wt 123.4 lb

## 2019-07-30 DIAGNOSIS — Z13220 Encounter for screening for lipoid disorders: Secondary | ICD-10-CM | POA: Diagnosis not present

## 2019-07-30 DIAGNOSIS — R634 Abnormal weight loss: Secondary | ICD-10-CM

## 2019-07-30 DIAGNOSIS — Z130 Encounter for screening for diseases of the blood and blood-forming organs and certain disorders involving the immune mechanism: Secondary | ICD-10-CM

## 2019-07-30 DIAGNOSIS — R1012 Left upper quadrant pain: Secondary | ICD-10-CM

## 2019-07-30 DIAGNOSIS — R1013 Epigastric pain: Secondary | ICD-10-CM

## 2019-07-30 DIAGNOSIS — Z131 Encounter for screening for diabetes mellitus: Secondary | ICD-10-CM

## 2019-07-30 LAB — COMPREHENSIVE METABOLIC PANEL
ALT: 17 U/L (ref 0–35)
AST: 21 U/L (ref 0–37)
Albumin: 4.6 g/dL (ref 3.5–5.2)
Alkaline Phosphatase: 109 U/L (ref 39–117)
BUN: 26 mg/dL — ABNORMAL HIGH (ref 6–23)
CO2: 28 mEq/L (ref 19–32)
Calcium: 10.1 mg/dL (ref 8.4–10.5)
Chloride: 106 mEq/L (ref 96–112)
Creatinine, Ser: 0.73 mg/dL (ref 0.40–1.20)
GFR: 100.81 mL/min (ref >60.00)
Glucose, Bld: 98 mg/dL (ref 70–99)
Potassium: 5.3 mEq/L — ABNORMAL HIGH (ref 3.5–5.1)
Sodium: 141 mEq/L (ref 135–145)
Total Bilirubin: 0.4 mg/dL (ref 0.2–1.2)
Total Protein: 7.1 g/dL (ref 6.0–8.3)

## 2019-07-30 LAB — TSH: TSH: 0.81 u[IU]/mL (ref 0.35–4.50)

## 2019-07-30 LAB — LIPID PANEL
Cholesterol: 153 mg/dL (ref 0–200)
HDL: 63 mg/dL (ref >39.00)
LDL Cholesterol: 84 mg/dL (ref 0–99)
NonHDL: 90.36
Total CHOL/HDL Ratio: 2
Triglycerides: 32 mg/dL (ref 0.0–149.0)
VLDL: 6.4 mg/dL (ref 0.0–40.0)

## 2019-07-30 LAB — CBC
HCT: 37.2 % (ref 36.0–46.0)
Hemoglobin: 12.4 g/dL (ref 12.0–15.0)
MCHC: 33.4 g/dL (ref 30.0–36.0)
MCV: 95.4 fl (ref 78.0–100.0)
Platelets: 187 10*3/uL (ref 150.0–400.0)
RBC: 3.9 Mil/uL (ref 3.87–5.11)
RDW: 13.5 % (ref 11.5–15.5)
WBC: 2.5 10*3/uL — ABNORMAL LOW (ref 4.0–10.5)

## 2019-07-30 LAB — AMYLASE: Amylase: 85 U/L (ref 27–131)

## 2019-07-30 LAB — LIPASE: Lipase: 62 U/L — ABNORMAL HIGH (ref 11.0–59.0)

## 2019-07-30 LAB — HEMOGLOBIN A1C: Hgb A1c MFr Bld: 5.9 % (ref 4.6–6.5)

## 2019-08-03 ENCOUNTER — Ambulatory Visit (HOSPITAL_BASED_OUTPATIENT_CLINIC_OR_DEPARTMENT_OTHER)
Admission: RE | Admit: 2019-08-03 | Discharge: 2019-08-03 | Disposition: A | Discharge status: Home / Self Care | Payer: BLUE CROSS/BLUE SHIELD | Source: Ambulatory Visit | Attending: Family Medicine | Admitting: Family Medicine

## 2019-08-03 ENCOUNTER — Other Ambulatory Visit: Payer: Self-pay

## 2019-08-03 DIAGNOSIS — R1012 Left upper quadrant pain: Secondary | ICD-10-CM | POA: Insufficient documentation

## 2019-08-03 DIAGNOSIS — K8020 Calculus of gallbladder without cholecystitis without obstruction: Secondary | ICD-10-CM | POA: Diagnosis not present

## 2019-08-03 DIAGNOSIS — K8050 Calculus of bile duct without cholangitis or cholecystitis without obstruction: Secondary | ICD-10-CM | POA: Diagnosis not present

## 2019-08-06 ENCOUNTER — Telehealth: Payer: Self-pay | Admitting: Family Medicine

## 2019-08-06 DIAGNOSIS — R1012 Left upper quadrant pain: Secondary | ICD-10-CM

## 2019-08-06 DIAGNOSIS — R1013 Epigastric pain: Secondary | ICD-10-CM

## 2019-08-06 NOTE — Telephone Encounter (Signed)
Called to discuss her RUQ US  IMPRESSION: 1. Small gallbladder polyps, largest measuring 3 mm. Given size of polyps, no dedicated further imaging or follow-up is needed. No gallstones. 2. Mildly dilated common bile duct at 8 mm without visualized choledocholithiasis. This is slightly increased from 2017 CT. Recommend correlation with liver enzymes. If elevated, recommend further evaluation with MRCP.  Her LFTs are normal but I am still suspicious given her symptoms Will refer to GI She is doing ok now as far as sx by modifying her diet and avoiding fats

## 2019-08-13 ENCOUNTER — Telehealth: Payer: Self-pay | Admitting: Family Medicine

## 2019-08-13 NOTE — Telephone Encounter (Signed)
Done

## 2019-08-13 NOTE — Telephone Encounter (Signed)
Caller: Maryna Call back phone number: [phone]  Patient states a referral was sent to see GI upstairs, however, they are scheduling far out. Patient was able to find an earlier appointment with Dr. Loreta Ave at [facility] center. Patient states she needs a new referral to Dr. Loreta Ave her appointment is scheduled for 08/23/19.   Meghan Travis

## 2019-08-23 DIAGNOSIS — R1012 Left upper quadrant pain: Secondary | ICD-10-CM | POA: Diagnosis not present

## 2019-08-23 DIAGNOSIS — R634 Abnormal weight loss: Secondary | ICD-10-CM | POA: Diagnosis not present

## 2019-08-23 DIAGNOSIS — K219 Gastro-esophageal reflux disease without esophagitis: Secondary | ICD-10-CM | POA: Diagnosis not present

## 2019-11-07 NOTE — Patient Instructions (Addendum)
It was great to see you again today, I will be in touch with your labs soon as possible voltaren gel should work well on your painful foot Let me know if your foot and/ or ear pain does not resolve Flu shot given today

## 2019-11-07 NOTE — Progress Notes (Addendum)
[facility] [address], [address] [place], [place] [address] [phone]  Date:  11/08/2019   Name:  Meghan Travis   DOB:  November 19, 1965   MRN:  [id number]  PCP:  Pearline Cables, MD    Chief Complaint: Medication Refill (flexiril, tizanidine reacts negativly with pepcid) and Ear Pain (right ear pain, radiating down neck, one week, facial pressure)   History of Present Illness:  Meghan Travis is a 54 y.o. very pleasant female patient who presents with the following:  Patient with history of migraine headache and cervical spine disease, here today for a routine follow-up visit  Last seen by myself in June of this year: Generally healthy woman who is seen today with concern of abdominal pain which can be postprandial.  She is also lost a couple of pounds, which may be due to diet change and increased exercise.  However, she is concerned about her gallbladder or pancreatic issue.  We will plan to get labs as above, will schedule right upper quadrant ultrasound ASAP.  If these are noncontributory, plan to proceed to CT scan. I have asked her to let me know if her symptoms are worsening or change in the meantime  Her belly pain seemed to resolve.  She saw Dr Loreta Ave who gave her famotitdine and this helped a lot, they think she was having gastritis or possibly an ulcer  Flu vaccine- today  Need to repeat CBC, CMP today She has completed her COVID-19 series  She has pain in her left foot that started one week ago She is not aware of any injury or any cause of the pain.  She does not have history of gout  She has noted pain behind her right ear for about one week No hearing change, no tinnitus in her ears  She does note some sinus pressure and pain for about 1 week.  No cough or sore throat Patient Active Problem List   Diagnosis Date Noted  . Cervical spondylosis with myelopathy 02/19/2013  . Herniated nucleus pulposus with  myelopathy, cervical 01/13/2013    Past Medical History:  Diagnosis Date  . Allergy   . WGYKZLDJ(570.1)    miagrine-    Past Surgical History:  Procedure Laterality Date  . ANTERIOR CERVICAL DECOMP/DISCECTOMY FUSION N/A 02/19/2013   Procedure: CERVICAL FOUR-FIVE,CERVICAL FIVE -SIX ANTERIOR CERVICAL DECOMPRESSION/FUSION;  Surgeon: Barnett Abu, MD;  Location: [facility];  Service: Neurosurgery;  Laterality: N/A;  . CESAREAN SECTION    . TUBAL LIGATION    . tubal reveral       Social History   Tobacco Use  . Smoking status: Never Smoker  . Smokeless tobacco: Never Used  Substance Use Topics  . Alcohol use: No    Alcohol/week: 0.0 standard drinks  . Drug use: No    Family History  Problem Relation Age of Onset  . Hyperlipidemia Mother   . Hypertension Mother   . Hyperlipidemia Father   . Hypertension Father     No Known Allergies  Medication list has been reviewed and updated.  Current Outpatient Medications on File Prior to Visit  Medication Sig Dispense Refill  . cyclobenzaprine (FLEXERIL) 10 MG tablet Take 1 tablet (10 mg total) by mouth at bedtime. Use as needed for muscle spasm 30 tablet 1  . diclofenac (VOLTAREN) 75 MG EC tablet TAKE ONE TABLET BY MOUTH TWICE DAILY WITH FOOD AS NEEDED FOR PAIN 60 tablet 3  .  gabapentin (NEURONTIN) 300 MG capsule 1 tab po at bedtime 1st day, 1 tablet bid second day, then 1 tablet tid (Patient taking differently: Take 300 mg by mouth daily as needed (pain). ) 30 capsule 0  . ibuprofen (ADVIL,MOTRIN) 800 MG tablet Take 800 mg by mouth every 8 (eight) hours as needed for mild pain.    Marland Kitchen ipratropium (ATROVENT) 0.03 % nasal spray Place 2 sprays into the nose 4 (four) times daily. 30 mL 6  . metaxalone (SKELAXIN) 800 MG tablet Take 1 tablet (800 mg total) by mouth 3 (three) times daily. Use as needed for pain 60 tablet 5  . montelukast (SINGULAIR) 10 MG tablet Take 10 mg by mouth at bedtime.    Marland Kitchen tiZANidine (ZANAFLEX) 2 MG tablet Take  1 tablet (2 mg total) by mouth every 6 (six) hours as needed for muscle spasms. 40 tablet 1   No current facility-administered medications on file prior to visit.    Review of Systems:  As per HPI- otherwise negative.   Physical Examination: Vitals:   11/08/19 1346  BP: 110/72  Pulse: 86  Resp: 16  SpO2: 99%   Vitals:   11/08/19 1346  Weight: 120 lb (54.4 kg)  Height: 5\' 6"  (1.676 m)   Body mass index is 19.37 kg/m. Ideal Body Weight: Weight in (lb) to have BMI = 25: 154.6  GEN: no acute distress.  Slender build, looks well HEENT: Atraumatic, Normocephalic. Bilateral TM wnl, oropharynx normal.  PEERL,EOMI. nasal cavity is congested The patient is tender behind her right ear, over the musculature of her neck.  I cannot palpate any masses or lymphadenopathy.  No sign of infection Ears and Nose: No external deformity. CV: RRR, No M/G/R. No JVD. No thrill. No extra heart sounds. PULM: CTA B, no wheezes, crackles, rhonchi. No retractions. No resp. distress. No accessory muscle use. ABD: S, NT, ND, +BS. No rebound. No HSM. EXTR: No c/c/e PSYCH: Normally interactive. Conversant.  She is also tender over the right medial midfoot-there is no redness, swelling, bruise or other sign of acute injury   Assessment and Plan: Leukopenia, unspecified type - Plan: CBC  Serum potassium elevated - Plan: Comprehensive metabolic panel  Encounter for hepatitis C screening test for low risk patient - Plan: Hepatitis C antibody  Acute non-recurrent frontal sinusitis - Plan: amoxicillin (AMOXIL) 500 MG capsule  Needs flu shot  Right foot pain - Plan: Uric acid  Elevated lipase - Plan: Lipase  Patient today with a couple of concerns Follow-up on lab abnormalities today I advised patient that I cannot definitively explain the pain behind her ear.  I do not see any sign of infection or lymphadenopathy.  This may potentially be due to a sinus infection, we will try treating her with  amoxicillin Pain in her foot is also difficult to diagnose.  This may be gout.  Will check a uric acid level, advised her to try Voltaren gel Flu shot given Will plan further follow- up pending labs. I asked her let me know if anything was getting worse, or not improving in the next few days  This visit occurred during the SARS-CoV-2 public health emergency.  Safety protocols were in place, including screening questions prior to the visit, additional usage of staff PPE, and extensive cleaning of exam room while observing appropriate contact time as indicated for disinfecting solutions.    Signed , MD  10/8- received her labs as below.  Letter to pt  Results for orders  placed or performed in visit on 11/08/19  CBC  Result Value Ref Range   WBC 3.1 (L) 3.8 - 10.8 Thousand/uL   RBC 3.98 3.80 - 5.10 Million/uL   Hemoglobin 12.4 11.7 - 15.5 g/dL   HCT 10.1 35 - 45 %   MCV 95.5 80.0 - 100.0 fL   MCH 31.2 27.0 - 33.0 pg   MCHC 32.6 32.0 - 36.0 g/dL   RDW 75.1 02.5 - 85.2 %   Platelets 227 140 - 400 Thousand/uL   MPV 11.5 7.5 - 12.5 fL  Comprehensive metabolic panel  Result Value Ref Range   Glucose, Bld 81 65 - 99 mg/dL   BUN 23 7 - 25 mg/dL   Creat 7.78 2.42 - 3.53 mg/dL   BUN/Creatinine Ratio NOT APPLICABLE 6 - 22 (calc)   Sodium 140 135 - 146 mmol/L   Potassium 4.9 3.5 - 5.3 mmol/L   Chloride 104 98 - 110 mmol/L   CO2 27 20 - 32 mmol/L   Calcium 10.1 8.6 - 10.4 mg/dL   Total Protein 7.1 6.1 - 8.1 g/dL   Albumin 4.5 3.6 - 5.1 g/dL   Globulin 2.6 1.9 - 3.7 g/dL (calc)   AG Ratio 1.7 1.0 - 2.5 (calc)   Total Bilirubin 0.5 0.2 - 1.2 mg/dL   Alkaline phosphatase (APISO) 102 37 - 153 U/L   AST 18 10 - 35 U/L   ALT 13 6 - 29 U/L  Hepatitis C antibody  Result Value Ref Range   Hepatitis C Ab NON-REACTIVE NON-REACTI   SIGNAL TO CUT-OFF 0.02 <1.00  Uric acid  Result Value Ref Range   Uric Acid, Serum 2.4 (L) 2.5 - 7.0 mg/dL  Lipase  Result Value Ref Range    Lipase 12 7 - 60 U/L

## 2019-11-08 ENCOUNTER — Ambulatory Visit: Payer: BLUE CROSS/BLUE SHIELD | Admitting: Family Medicine

## 2019-11-08 ENCOUNTER — Other Ambulatory Visit: Payer: Self-pay

## 2019-11-08 ENCOUNTER — Encounter: Payer: Self-pay | Admitting: Family Medicine

## 2019-11-08 VITALS — BP 110/72 | HR 86 | Resp 16 | Ht 66.0 in | Wt 120.0 lb

## 2019-11-08 DIAGNOSIS — Z1159 Encounter for screening for other viral diseases: Secondary | ICD-10-CM

## 2019-11-08 DIAGNOSIS — E875 Hyperkalemia: Secondary | ICD-10-CM

## 2019-11-08 DIAGNOSIS — D72819 Decreased white blood cell count, unspecified: Secondary | ICD-10-CM

## 2019-11-08 DIAGNOSIS — R748 Abnormal levels of other serum enzymes: Secondary | ICD-10-CM

## 2019-11-08 DIAGNOSIS — M79671 Pain in right foot: Secondary | ICD-10-CM | POA: Diagnosis not present

## 2019-11-08 DIAGNOSIS — J0110 Acute frontal sinusitis, unspecified: Secondary | ICD-10-CM

## 2019-11-08 DIAGNOSIS — Z23 Encounter for immunization: Secondary | ICD-10-CM

## 2019-11-08 MED ORDER — AMOXICILLIN 500 MG PO CAPS: 1000.0000 mg | ORAL_CAPSULE | Freq: Two times a day (BID) | ORAL | 0 refills | Status: DC

## 2019-11-09 LAB — COMPREHENSIVE METABOLIC PANEL
AG Ratio: 1.7 (calc) (ref 1.0–2.5)
ALT: 13 U/L (ref 6–29)
AST: 18 U/L (ref 10–35)
Albumin: 4.5 g/dL (ref 3.6–5.1)
Alkaline phosphatase (APISO): 102 U/L (ref 37–153)
BUN: 23 mg/dL (ref 7–25)
CO2: 27 mmol/L (ref 20–32)
Calcium: 10.1 mg/dL (ref 8.6–10.4)
Chloride: 104 mmol/L (ref 98–110)
Creat: 0.73 mg/dL (ref 0.50–1.05)
Globulin: 2.6 g/dL (calc) (ref 1.9–3.7)
Glucose, Bld: 81 mg/dL (ref 65–99)
Potassium: 4.9 mmol/L (ref 3.5–5.3)
Sodium: 140 mmol/L (ref 135–146)
Total Bilirubin: 0.5 mg/dL (ref 0.2–1.2)
Total Protein: 7.1 g/dL (ref 6.1–8.1)

## 2019-11-09 LAB — CBC
HCT: 38 % (ref 35.0–45.0)
Hemoglobin: 12.4 g/dL (ref 11.7–15.5)
MCH: 31.2 pg (ref 27.0–33.0)
MCHC: 32.6 g/dL (ref 32.0–36.0)
MCV: 95.5 fL (ref 80.0–100.0)
MPV: 11.5 fL (ref 7.5–12.5)
Platelets: 227 10*3/uL (ref 140–400)
RBC: 3.98 10*6/uL (ref 3.80–5.10)
RDW: 12.2 % (ref 11.0–15.0)
WBC: 3.1 10*3/uL — ABNORMAL LOW (ref 3.8–10.8)

## 2019-11-09 LAB — LIPASE: Lipase: 12 U/L (ref 7–60)

## 2019-11-09 LAB — URIC ACID: Uric Acid, Serum: 2.4 mg/dL — ABNORMAL LOW (ref 2.5–7.0)

## 2019-11-09 LAB — HEPATITIS C ANTIBODY
Hepatitis C Ab: NONREACTIVE
SIGNAL TO CUT-OFF: 0.02 (ref <1.00)

## 2020-04-29 ENCOUNTER — Other Ambulatory Visit (HOSPITAL_BASED_OUTPATIENT_CLINIC_OR_DEPARTMENT_OTHER): Payer: Self-pay | Admitting: Family Medicine

## 2020-04-29 DIAGNOSIS — Z1231 Encounter for screening mammogram for malignant neoplasm of breast: Secondary | ICD-10-CM

## 2020-05-02 ENCOUNTER — Other Ambulatory Visit: Payer: Self-pay

## 2020-05-02 ENCOUNTER — Ambulatory Visit (HOSPITAL_BASED_OUTPATIENT_CLINIC_OR_DEPARTMENT_OTHER)
Admission: RE | Admit: 2020-05-02 | Discharge: 2020-05-02 | Disposition: A | Discharge status: Home / Self Care | Payer: BLUE CROSS/BLUE SHIELD | Source: Ambulatory Visit | Attending: Family Medicine | Admitting: Family Medicine

## 2020-05-02 DIAGNOSIS — Z1231 Encounter for screening mammogram for malignant neoplasm of breast: Secondary | ICD-10-CM | POA: Diagnosis not present

## 2020-05-06 ENCOUNTER — Other Ambulatory Visit: Payer: Self-pay | Admitting: Family Medicine

## 2020-05-06 DIAGNOSIS — R928 Other abnormal and inconclusive findings on diagnostic imaging of breast: Secondary | ICD-10-CM

## 2020-05-30 ENCOUNTER — Ambulatory Visit
Admission: RE | Admit: 2020-05-30 | Discharge: 2020-05-30 | Disposition: A | Discharge status: Home / Self Care | Payer: BLUE CROSS/BLUE SHIELD | Source: Ambulatory Visit | Attending: Family Medicine | Admitting: Family Medicine

## 2020-05-30 ENCOUNTER — Other Ambulatory Visit: Payer: Self-pay | Admitting: Family Medicine

## 2020-05-30 ENCOUNTER — Other Ambulatory Visit: Payer: Self-pay

## 2020-05-30 DIAGNOSIS — R922 Inconclusive mammogram: Secondary | ICD-10-CM | POA: Diagnosis not present

## 2020-05-30 DIAGNOSIS — R928 Other abnormal and inconclusive findings on diagnostic imaging of breast: Secondary | ICD-10-CM

## 2020-05-30 DIAGNOSIS — N6321 Unspecified lump in the left breast, upper outer quadrant: Secondary | ICD-10-CM | POA: Diagnosis not present

## 2020-06-07 NOTE — Progress Notes (Addendum)
[facility] [address], [address] [place], [place] [address] [phone]  Date:  06/09/2020   Name:  Meghan Travis   DOB:  1965/11/23   MRN:  [id number]  PCP:  Pearline Cables, MD    Chief Complaint: Annual Exam   History of Present Illness:  Meghan Travis is a 55 y.o. very pleasant female patient who presents with the following:  Patient seen today for physical exam Last visit with myself in October 2021-history of migraine headache and cervical spine disease, prediabetes At her last visit she had mild leukopenia which is not unusual, will repeat today  COVID-19 booster- recommended to her  Pap is up-to-date Mammogram up-to-date Cologuard up-to-date Shingrix- we discussed with her today, she is thinking about this  Most recent labs done in October  She is very busy and extremely physically active She is taking care of her 49 year old grandson a few times a week  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  She is not sure what might of caused this   Wt Readings from Last 3 Encounters:  06/09/20 116 lb (52.6 kg)  11/08/19 120 lb (54.4 kg)  07/30/19 123 lb 6.4 oz (56 kg)    Patient Active Problem List   Diagnosis Date Noted  . Cervical spondylosis with myelopathy 02/19/2013  . Herniated nucleus pulposus with myelopathy, cervical 01/13/2013    Past Medical History:  Diagnosis Date  . Allergy   . FVCBSWHQ(759.1)    miagrine-     Past Surgical History:  Procedure Laterality Date  . ANTERIOR CERVICAL DECOMP/DISCECTOMY FUSION N/A 02/19/2013   Procedure: CERVICAL FOUR-FIVE,CERVICAL FIVE -SIX ANTERIOR CERVICAL DECOMPRESSION/FUSION;  Surgeon: Barnett Abu, MD;  Location: [facility];  Service: Neurosurgery;  Laterality: N/A;  . CESAREAN SECTION    . TUBAL LIGATION    . tubal reveral       Social History   Tobacco Use  . Smoking status: Never Smoker  . Smokeless tobacco: Never Used  Substance Use Topics  . Alcohol use: No    Alcohol/week: 0.0 standard drinks  . Drug use: No    Family History  Problem Relation Age of Onset  . Hyperlipidemia Mother   . Hypertension Mother   . Hyperlipidemia Father   . Hypertension Father     No Known Allergies  Medication list has been reviewed and updated.  Current Outpatient Medications on File Prior to Visit  Medication Sig Dispense Refill  . cyclobenzaprine (FLEXERIL) 10 MG tablet Take 1 tablet (10 mg total) by mouth at bedtime. Use as needed for muscle spasm 30 tablet 1  . diclofenac (VOLTAREN) 75 MG EC tablet TAKE ONE TABLET BY MOUTH TWICE DAILY WITH FOOD AS NEEDED FOR PAIN 60 tablet 3  . gabapentin (NEURONTIN) 300 MG capsule 1 tab po at bedtime 1st day, 1 tablet bid second day, then 1 tablet tid (Patient taking  differently: Take 300 mg by mouth daily as needed (pain).) 30 capsule 0  . ipratropium (ATROVENT) 0.03 % nasal spray Place 2 sprays into the nose 4 (four) times daily. 30 mL 6  . metaxalone (SKELAXIN) 800 MG tablet Take 1 tablet (800 mg total) by mouth 3 (three) times daily. Use as needed for pain 60 tablet 5  . montelukast (SINGULAIR) 10 MG tablet Take 10 mg by mouth at bedtime.     No current facility-administered medications on file prior to visit.    Review of Systems:  As per HPI- otherwise negative.   Physical Examination: Vitals:   06/09/20 1017  BP: 116/62  Pulse: 90  Resp: 16  Temp: 97.7 F (36.5 C)  SpO2: 99%   Vitals:    06/09/20 1017  Weight: 116 lb (52.6 kg)  Height: 5\' 6"  (1.676 m)   Body mass index is 18.72 kg/m. Ideal Body Weight: Weight in (lb) to have BMI = 25: 154.6  GEN: no acute distress.  Always slim, but has lost weight HEENT: Atraumatic, Normocephalic.   Bilateral TM wnl, oropharynx normal.  PEERL,EOMI.   She has some nasal cavity inflammation and tenderness over the right sinuses Ears and Nose: No external deformity. CV: RRR, No M/G/R. No JVD. No thrill. No extra heart sounds. PULM: CTA B, no wheezes, crackles, rhonchi. No retractions. No resp. distress. No accessory muscle use. ABD: S, NT, ND, +BS. No rebound. No HSM. EXTR: No c/c/e PSYCH: Normally interactive. Conversant.  There is a firm, slightly mobile bump over the right lateral clavicle      Assessment and Plan: Physical exam  Leukopenia, unspecified type - Plan: CBC  Serum potassium elevated - Plan: Basic metabolic panel  Screening for diabetes mellitus - Plan: Hemoglobin A1c  Screening for hyperlipidemia - Plan: Lipid panel  Fatigue, unspecified type - Plan: TSH, VITAMIN D 25 Hydroxy (Vit-D Deficiency, Fractures)  Acute non-recurrent frontal sinusitis - Plan: amoxicillin (AMOXIL) 500 MG capsule  Chronic neck pain - Plan: cyclobenzaprine (FLEXERIL) 10 MG tablet  Clavicle enlargement - Plan: DG Clavicle Right  Weight loss  Patient today for physical exam.  Encouraged healthy diet and exercise routine.  She has lost a few pounds, she tends to lose weight easily and is extremely physically active.  However, would like to see her again approximately 10 pounds.  We discussed this today.  I suggested that she track her calorie intake for a few days, and then attempt to add 200 to 300 cal/day-she will try  We will treat for sinus infection with amoxicillin Refill Flexeril which she takes on occasion for chronic neck and back pain  Will plan further follow- up pending labs. Encouraged needed immunizations  This visit  occurred during the SARS-CoV-2 public health emergency.  Safety protocols were in place, including screening questions prior to the visit, additional usage of staff PPE, and extensive cleaning of exam room while observing appropriate contact time as indicated for disinfecting solutions.    Signed , MD  Received her labs as below, letter to patient  Results for orders placed or performed in visit on 06/09/20  CBC  Result Value Ref Range   WBC 6.5 4.0 - 10.5 K/uL   RBC 3.85 (L) 3.87 - 5.11 Mil/uL   Platelets 169.0 150.0 - 400.0 K/uL   Hemoglobin 12.3 12.0 - 15.0 g/dL   HCT 08/09/20 99.8 - 33.8 %   MCV 95.3 78.0 - 100.0 fl   MCHC 33.6 30.0 - 36.0 g/dL  RDW 13.0 11.5 - 15.5 %  Basic metabolic panel  Result Value Ref Range   Sodium 140 135 - 145 mEq/L   Potassium 4.7 3.5 - 5.1 mEq/L   Chloride 106 96 - 112 mEq/L   CO2 28 19 - 32 mEq/L   Glucose, Bld 93 70 - 99 mg/dL   BUN 19 6 - 23 mg/dL   Creatinine, Ser 7.78 0.40 - 1.20 mg/dL   GFR 242.35 >36.14 mL/min   Calcium 9.5 8.4 - 10.5 mg/dL  TSH  Result Value Ref Range   TSH 0.69 0.35 - 4.50 uIU/mL  VITAMIN D 25 Hydroxy (Vit-D Deficiency, Fractures)  Result Value Ref Range   VITD 31.31 30.00 - 100.00 ng/mL  Lipid panel  Result Value Ref Range   Cholesterol 161 0 - 200 mg/dL   Triglycerides 43.1 0.0 - 149.0 mg/dL   HDL 54.00 >86.76 mg/dL   VLDL 7.6 0.0 - 19.5 mg/dL   LDL Cholesterol 80 0 - 99 mg/dL   Total CHOL/HDL Ratio 2    NonHDL 87.19   Hemoglobin A1c  Result Value Ref Range   Hgb A1c MFr Bld 5.9 4.6 - 6.5 %    DG Clavicle Right  Result Date: 06/09/2020 CLINICAL DATA:  Soft tissue prominence near acromioclavicular joint EXAM: RIGHT CLAVICLE - 2+ VIEWS COMPARISON:  None. FINDINGS: Frontal and angled frontal images were obtained. No fracture or dislocation. Joint spaces appear normal. No erosive change. No soft tissue lesion evident. Visualized right lung clear. IMPRESSION: No appreciable soft tissue mass  appreciable by radiography. No fracture or dislocation. No appreciable arthropathic change. Electronically Signed   By: Bretta Bang III M.D.   On: 06/09/2020 11:56

## 2020-06-07 NOTE — Patient Instructions (Addendum)
It was good seeing you today, I will be in touch with the results of soon as possible We will treat you for a sinus infection with amoxicillin for 10 days Try to track your calories for a few days, and then add 200- 300 calories daily in order to gain a few lbs I refilled your muscle relaxer- flexeril- to use as needed   We can get an x-ray of your shoulder- please go to the ground floor imaging facility to have this done   Please consider getting the covid booster/ 4th dose if not done already and the shingles series (shingrix)    Health Maintenance, Female Adopting a healthy lifestyle and getting preventive care are important in promoting health and wellness. Ask your health care provider about:  The right schedule for you to have regular tests and exams.  Things you can do on your own to prevent diseases and keep yourself healthy. What should I know about diet, weight, and exercise? Eat a healthy diet  Eat a diet that includes plenty of vegetables, fruits, low-fat dairy products, and lean protein.  Do not eat a lot of foods that are high in solid fats, added sugars, or sodium.   Maintain a healthy weight Body mass index (BMI) is used to identify weight problems. It estimates body fat based on height and weight. Your health care provider can help determine your BMI and help you achieve or maintain a healthy weight. Get regular exercise Get regular exercise. This is one of the most important things you can do for your health. Most adults should:  Exercise for at least 150 minutes each week. The exercise should increase your heart rate and make you sweat (moderate-intensity exercise).  Do strengthening exercises at least twice a week. This is in addition to the moderate-intensity exercise.  Spend less time sitting. Even light physical activity can be beneficial. Watch cholesterol and blood lipids Have your blood tested for lipids and cholesterol at 54 years of age, then have this  test every 5 years. Have your cholesterol levels checked more often if:  Your lipid or cholesterol levels are high.  You are older than 54 years of age.  You are at high risk for heart disease. What should I know about cancer screening? Depending on your health history and family history, you may need to have cancer screening at various ages. This may include screening for:  Breast cancer.  Cervical cancer.  Colorectal cancer.  Skin cancer.  Lung cancer. What should I know about heart disease, diabetes, and high blood pressure? Blood pressure and heart disease  High blood pressure causes heart disease and increases the risk of stroke. This is more likely to develop in people who have high blood pressure readings, are of African descent, or are overweight.  Have your blood pressure checked: ? Every 3-5 years if you are 57-84 years of age. ? Every year if you are 46 years old or older. Diabetes Have regular diabetes screenings. This checks your fasting blood sugar level. Have the screening done:  Once every three years after age 35 if you are at a normal weight and have a low risk for diabetes.  More often and at a younger age if you are overweight or have a high risk for diabetes. What should I know about preventing infection? Hepatitis B If you have a higher risk for hepatitis B, you should be screened for this virus. Talk with your health care provider to find out if you  are at risk for hepatitis B infection. Hepatitis C Testing is recommended for:  Everyone born from [birth year] through 1964.  Anyone with known risk factors for hepatitis C. Sexually transmitted infections (STIs)  Get screened for STIs, including gonorrhea and chlamydia, if: ? You are sexually active and are younger than 54 years of age. ? You are older than 54 years of age and your health care provider tells you that you are at risk for this type of infection. ? Your sexual activity has changed since you  were last screened, and you are at increased risk for chlamydia or gonorrhea. Ask your health care provider if you are at risk.  Ask your health care provider about whether you are at high risk for HIV. Your health care provider may recommend a prescription medicine to help prevent HIV infection. If you choose to take medicine to prevent HIV, you should first get tested for HIV. You should then be tested every 3 months for as long as you are taking the medicine. Pregnancy  If you are about to stop having your period (premenopausal) and you may become pregnant, seek counseling before you get pregnant.  Take 400 to 800 micrograms (mcg) of folic acid every day if you become pregnant.  Ask for birth control (contraception) if you want to prevent pregnancy. Osteoporosis and menopause Osteoporosis is a disease in which the bones lose minerals and strength with aging. This can result in bone fractures. If you are 37 years old or older, or if you are at risk for osteoporosis and fractures, ask your health care provider if you should:  Be screened for bone loss.  Take a calcium or vitamin D supplement to lower your risk of fractures.  Be given hormone replacement therapy (HRT) to treat symptoms of menopause. Follow these instructions at home: Lifestyle  Do not use any products that contain nicotine or tobacco, such as cigarettes, e-cigarettes, and chewing tobacco. If you need help quitting, ask your health care provider.  Do not use street drugs.  Do not share needles.  Ask your health care provider for help if you need support or information about quitting drugs. Alcohol use  Do not drink alcohol if: ? Your health care provider tells you not to drink. ? You are pregnant, may be pregnant, or are planning to become pregnant.  If you drink alcohol: ? Limit how much you use to 0-1 drink a day. ? Limit intake if you are breastfeeding.  Be aware of how much alcohol is in your drink. In the  [place] one drink equals one 12 oz bottle of beer (355 mL), one 5 oz glass of wine (148 mL), or one 1 oz glass of hard liquor (44 mL). General instructions  Schedule regular health, dental, and eye exams.  Stay current with your vaccines.  Tell your health care provider if: ? You often feel depressed. ? You have ever been abused or do not feel safe at home. Summary  Adopting a healthy lifestyle and getting preventive care are important in promoting health and wellness.  Follow your health care provider's instructions about healthy diet, exercising, and getting tested or screened for diseases.  Follow your health care provider's instructions on monitoring your cholesterol and blood pressure. This information is not intended to replace advice given to you by your health care provider. Make sure you discuss any questions you have with your health care provider. Document Revised: 01/11/2018 Document Reviewed: 01/11/2018 Elsevier Patient Education  2021 [employer].

## 2020-06-09 ENCOUNTER — Ambulatory Visit (INDEPENDENT_AMBULATORY_CARE_PROVIDER_SITE_OTHER): Payer: BLUE CROSS/BLUE SHIELD | Admitting: Family Medicine

## 2020-06-09 ENCOUNTER — Other Ambulatory Visit: Payer: Self-pay

## 2020-06-09 ENCOUNTER — Ambulatory Visit (HOSPITAL_BASED_OUTPATIENT_CLINIC_OR_DEPARTMENT_OTHER)
Admission: RE | Admit: 2020-06-09 | Discharge: 2020-06-09 | Disposition: A | Discharge status: Home / Self Care | Payer: BLUE CROSS/BLUE SHIELD | Source: Ambulatory Visit | Attending: Family Medicine | Admitting: Family Medicine

## 2020-06-09 VITALS — BP 116/62 | HR 90 | Temp 97.7°F | Resp 16 | Ht 66.0 in | Wt 116.0 lb

## 2020-06-09 DIAGNOSIS — Z131 Encounter for screening for diabetes mellitus: Secondary | ICD-10-CM | POA: Diagnosis not present

## 2020-06-09 DIAGNOSIS — E875 Hyperkalemia: Secondary | ICD-10-CM

## 2020-06-09 DIAGNOSIS — G8929 Other chronic pain: Secondary | ICD-10-CM

## 2020-06-09 DIAGNOSIS — M89319 Hypertrophy of bone, unspecified shoulder: Secondary | ICD-10-CM | POA: Diagnosis not present

## 2020-06-09 DIAGNOSIS — R5383 Other fatigue: Secondary | ICD-10-CM | POA: Diagnosis not present

## 2020-06-09 DIAGNOSIS — D72819 Decreased white blood cell count, unspecified: Secondary | ICD-10-CM

## 2020-06-09 DIAGNOSIS — Z0000 Encounter for general adult medical examination without abnormal findings: Secondary | ICD-10-CM

## 2020-06-09 DIAGNOSIS — M542 Cervicalgia: Secondary | ICD-10-CM

## 2020-06-09 DIAGNOSIS — R634 Abnormal weight loss: Secondary | ICD-10-CM

## 2020-06-09 DIAGNOSIS — R7303 Prediabetes: Secondary | ICD-10-CM | POA: Insufficient documentation

## 2020-06-09 DIAGNOSIS — Z13220 Encounter for screening for lipoid disorders: Secondary | ICD-10-CM | POA: Diagnosis not present

## 2020-06-09 DIAGNOSIS — M7989 Other specified soft tissue disorders: Secondary | ICD-10-CM | POA: Diagnosis not present

## 2020-06-09 DIAGNOSIS — J0110 Acute frontal sinusitis, unspecified: Secondary | ICD-10-CM

## 2020-06-09 LAB — HEMOGLOBIN A1C: Hgb A1c MFr Bld: 5.9 % (ref 4.6–6.5)

## 2020-06-09 LAB — LIPID PANEL
Cholesterol: 161 mg/dL (ref 0–200)
HDL: 74.2 mg/dL (ref >39.00)
LDL Cholesterol: 80 mg/dL (ref 0–99)
NonHDL: 87.19
Total CHOL/HDL Ratio: 2
Triglycerides: 38 mg/dL (ref 0.0–149.0)
VLDL: 7.6 mg/dL (ref 0.0–40.0)

## 2020-06-09 LAB — TSH: TSH: 0.69 u[IU]/mL (ref 0.35–4.50)

## 2020-06-09 LAB — CBC
HCT: 36.7 % (ref 36.0–46.0)
Hemoglobin: 12.3 g/dL (ref 12.0–15.0)
MCHC: 33.6 g/dL (ref 30.0–36.0)
MCV: 95.3 fl (ref 78.0–100.0)
Platelets: 169 10*3/uL (ref 150.0–400.0)
RBC: 3.85 Mil/uL — ABNORMAL LOW (ref 3.87–5.11)
RDW: 13 % (ref 11.5–15.5)
WBC: 6.5 10*3/uL (ref 4.0–10.5)

## 2020-06-09 LAB — BASIC METABOLIC PANEL
BUN: 19 mg/dL (ref 6–23)
CO2: 28 mEq/L (ref 19–32)
Calcium: 9.5 mg/dL (ref 8.4–10.5)
Chloride: 106 mEq/L (ref 96–112)
Creatinine, Ser: 0.57 mg/dL (ref 0.40–1.20)
GFR: 103.24 mL/min (ref >60.00)
Glucose, Bld: 93 mg/dL (ref 70–99)
Potassium: 4.7 mEq/L (ref 3.5–5.1)
Sodium: 140 mEq/L (ref 135–145)

## 2020-06-09 LAB — VITAMIN D 25 HYDROXY (VIT D DEFICIENCY, FRACTURES): VITD: 31.31 ng/mL (ref 30.00–100.00)

## 2020-06-09 MED ORDER — AMOXICILLIN 500 MG PO CAPS: 1000.0000 mg | ORAL_CAPSULE | Freq: Two times a day (BID) | ORAL | 0 refills | Status: DC

## 2020-06-09 MED ORDER — CYCLOBENZAPRINE HCL 10 MG PO TABS: 10.0000 mg | ORAL_TABLET | Freq: Every day | ORAL | 1 refills | Status: DC

## 2020-11-02 NOTE — Progress Notes (Signed)
[facility] at [facility] [address] [place], [place] [address] [phone]  Date:  11/05/2020   Name:  Meghan Travis   DOB:  1965/07/20   MRN:  [id number]  PCP:  Meghan Cables, MD    Chief Complaint: No chief complaint on file.   History of Present Illness:  Meghan Travis is a 55 y.o. very pleasant female patient who presents with the following:  Virtual visit today for concern of possible menopause symptoms Patient location is her car, my location is office.  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  She admits today that her husband has been unfaithful, they separated in spring of this year  Her LMP was less than a year ago  She is having some bleeding with intercourse recently- she thinks due to trauma during sex due to hormonal changes, and she may have some discomfort during intercourse. Her hot flashes are not that bad, but lack of sleep is really getting to her  She has  not tried any OTC meds as of yet for sleep  She uses gabapentin as needed- but does not use on a regular basis  Patient Active Problem List   Diagnosis Date Noted   Prediabetes 06/09/2020   Cervical spondylosis with myelopathy 02/19/2013   Herniated nucleus pulposus with myelopathy, cervical 01/13/2013    Past Medical History:  Diagnosis Date   Allergy    Headache(784.0)    miagrine-    Past Surgical History:  Procedure Laterality Date   ANTERIOR CERVICAL DECOMP/DISCECTOMY FUSION N/A 02/19/2013   Procedure: CERVICAL FOUR-FIVE,CERVICAL FIVE -SIX ANTERIOR CERVICAL DECOMPRESSION/FUSION;  Surgeon: Barnett Abu, MD;  Location: [facility];  Service: Neurosurgery;  Laterality: N/A;   CESAREAN SECTION     TUBAL LIGATION     tubal reveral       Social History   Tobacco Use   Smoking status: Never   Smokeless tobacco: Never  Substance Use Topics   Alcohol use: No    Alcohol/week: 0.0 standard drinks   Drug use: No    Family History  Problem Relation Age of Onset   Hyperlipidemia Mother    Hypertension Mother    Hyperlipidemia Father    Hypertension Father     No Known Allergies  Medication list has been reviewed and updated.  Current Outpatient Medications on File Prior to Visit  Medication Sig Dispense Refill   amoxicillin (AMOXIL) 500 MG capsule  Take 2 capsules (1,000 mg total) by mouth 2 (two) times daily. 40 capsule 0   cyclobenzaprine (FLEXERIL) 10 MG tablet Take 1 tablet (10 mg total) by mouth at bedtime. Use as needed for muscle spasm 30 tablet 1   diclofenac (VOLTAREN) 75 MG EC tablet TAKE ONE TABLET BY MOUTH TWICE DAILY WITH FOOD AS NEEDED FOR PAIN 60 tablet 3   gabapentin (NEURONTIN) 300 MG capsule 1 tab po at bedtime 1st day, 1 tablet bid second day, then 1 tablet tid (Patient taking differently: Take 300 mg by mouth daily as needed (pain).) 30 capsule 0   ipratropium (ATROVENT) 0.03 % nasal spray Place 2 sprays into the nose 4 (four) times daily. 30 mL 6    montelukast (SINGULAIR) 10 MG tablet Take 10 mg by mouth at bedtime.     No current facility-administered medications on file prior to visit.    Review of Systems:  As per HPI- otherwise negative.   Physical Examination: There were no vitals filed for this visit. There were no vitals filed for this visit. There is no height or weight on file to calculate BMI. Ideal Body Weight:    Patient observed via video monitor.  She looks well, no distress or shortness of breath is noted  Assessment and Plan: Adjustment insomnia - Plan: traZODone (DESYREL) 50 MG tablet  Gastroesophageal reflux disease, unspecified whether esophagitis present - Plan: famotidine (PEPCID) 40 MG tablet  Perimenopause  Discussed her symptoms today.  We decided to start on trazodone as needed for insomnia and also adjustment reaction.  She will use this as needed, she will let me know how it works for her and if urinary symptoms improve  Refilled her Pepcid that she uses as needed for GERD  Having some symptoms of perimenopause including vaginal dryness, discomfort with intercourse which may involve bleeding, poor sleep, hot flashes.  She is not currently using a lubricant during intercourse, encouraged her to try this and also possibly a vaginal moisturizer at baseline.  I did asked her to come in for an exam at her earliest convenience, would like to ensure there are no concerning changes to the vagina or cervix and collect a Pap-she agrees to do so  Signed Abbe Amsterdam, MD

## 2020-11-05 ENCOUNTER — Telehealth (INDEPENDENT_AMBULATORY_CARE_PROVIDER_SITE_OTHER): Payer: BLUE CROSS/BLUE SHIELD | Admitting: Family Medicine

## 2020-11-05 ENCOUNTER — Other Ambulatory Visit: Payer: Self-pay

## 2020-11-05 DIAGNOSIS — F5102 Adjustment insomnia: Secondary | ICD-10-CM | POA: Diagnosis not present

## 2020-11-05 DIAGNOSIS — K219 Gastro-esophageal reflux disease without esophagitis: Secondary | ICD-10-CM

## 2020-11-05 DIAGNOSIS — N951 Menopausal and female climacteric states: Secondary | ICD-10-CM

## 2020-11-05 MED ORDER — FAMOTIDINE 40 MG PO TABS: 40.0000 mg | ORAL_TABLET | Freq: Every day | ORAL | 3 refills | Status: AC

## 2020-11-05 MED ORDER — TRAZODONE HCL 50 MG PO TABS
25.0000 mg | ORAL_TABLET | Freq: Every evening | ORAL | 5 refills | Status: DC | PRN
Start: 2020-11-05 — End: 2022-12-06

## 2020-11-27 NOTE — Progress Notes (Signed)
[facility] at [facility] [address], [address] [place], [place] [address] [phone]  Date:  12/01/2020   Name:  Raiden Yearwood   DOB:  10-21-1965   MRN:  [id number]  PCP:  Pearline Cables, MD    Chief Complaint: Gynecologic Exam (Concerns/ questions: pt c/o a nodule on the middle finger of right hand that is painful/Flu shot today: no/)   History of Present Illness:  Goldye Tourangeau is a 55 y.o. very pleasant female patient who presents with the following:  Patient seen today for Pap smear Most recent Pap July 2020-negative, negative HPV  She also has noted a small bump on the ulnar aspect of her proximal right long finger for a few months.  It is tender she presses directly on it.  She was not certain what this might mean and if needs any other treatment  Patient Active Problem List   Diagnosis Date Noted   Prediabetes 06/09/2020   Cervical spondylosis with myelopathy 02/19/2013   Herniated nucleus pulposus with myelopathy, cervical 01/13/2013    Past Medical History:  Diagnosis Date   Allergy    Headache(784.0)    miagrine-    Past Surgical History:  Procedure Laterality Date   ANTERIOR CERVICAL DECOMP/DISCECTOMY FUSION N/A 02/19/2013   Procedure: CERVICAL FOUR-FIVE,CERVICAL FIVE -SIX ANTERIOR CERVICAL DECOMPRESSION/FUSION;  Surgeon: Barnett Abu, MD;  Location: [facility];  Service: Neurosurgery;  Laterality: N/A;   CESAREAN SECTION     TUBAL LIGATION     tubal reveral       Social History   Tobacco Use   Smoking status: Never   Smokeless tobacco: Never  Substance Use Topics   Alcohol use: No    Alcohol/week: 0.0 standard drinks   Drug use: No    Family History  Problem Relation Age of Onset   Hyperlipidemia Mother    Hypertension Mother    Hyperlipidemia Father    Hypertension Father     No Known Allergies  Medication list has been reviewed and updated.  Current Outpatient Medications on File Prior  to Visit  Medication Sig Dispense Refill   cyclobenzaprine (FLEXERIL) 10 MG tablet Take 1 tablet (10 mg total) by mouth at bedtime. Use as needed for muscle spasm 30 tablet 1   diclofenac (VOLTAREN) 75 MG EC tablet TAKE ONE TABLET BY MOUTH TWICE DAILY WITH FOOD AS NEEDED FOR PAIN 60 tablet 3   famotidine (PEPCID) 40 MG tablet Take 1 tablet (40 mg total) by mouth daily. Use as needed for stomach upset 90 tablet 3   gabapentin (NEURONTIN) 300 MG capsule 1 tab po at bedtime 1st day, 1 tablet bid second day, then 1 tablet tid (Patient taking differently: Take 300 mg by mouth daily as needed (pain).) 30 capsule 0   ipratropium (ATROVENT) 0.03 % nasal spray Place 2 sprays into the nose 4 (four) times daily. 30 mL 6   montelukast (SINGULAIR) 10 MG tablet Take 10 mg by mouth at bedtime.     traZODone (DESYREL) 50 MG tablet Take 0.5-1 tablets (25-50 mg total) by mouth at bedtime as needed for sleep. 30 tablet 5   No current facility-administered medications on file prior to visit.    Review of Systems:  As per HPI- otherwise negative.   Physical Examination: Vitals:   12/01/20 1031  BP: 120/80  Pulse: 77  Resp: 18  Temp: 98.2 F (36.8 C)  SpO2: 99%   Vitals:   12/01/20  1031  Weight: 130 lb 9.6 oz (59.2 kg)  Height: 5\' 6"  (1.676 m)   Body mass index is 21.08 kg/m. Ideal Body Weight: Weight in (lb) to have BMI = 25: 154.6  GEN: no acute distress.  Slim build, looks well HEENT: Atraumatic, Normocephalic.  Ears and Nose: No external deformity. CV: RRR, No M/G/R. No JVD. No thrill. No extra heart sounds. PULM: CTA B, no wheezes, crackles, rhonchi. No retractions. No resp. distress. No accessory muscle use. ABD: S, NT, ND. No rebound. No HSM. EXTR: No c/c/e PSYCH: Normally interactive. Conversant.  There is a small, mobile, rounded feeling cyst on the ulnar aspect of her proximal right long finger.  Is approximately 3 mm in diameter. Pelvic: normal, no vaginal lesions or discharge.  Uterus normal, no CMT, no adnexal tendereness or masses    Assessment and Plan: Screening for cervical cancer - Plan: Cytology - PAP  Pap smear collected today, we will be in touch with this report Discussed cyst on her finger.  Offered to have her seen by hand surgery for further evaluation.  For the time being she declines, will let me know if it gets worse  Signed , MD

## 2020-12-01 ENCOUNTER — Other Ambulatory Visit (HOSPITAL_COMMUNITY)
Admission: RE | Admit: 2020-12-01 | Discharge: 2020-12-01 | Disposition: A | Discharge status: Home / Self Care | Payer: BLUE CROSS/BLUE SHIELD | Source: Ambulatory Visit | Attending: Family Medicine | Admitting: Family Medicine

## 2020-12-01 ENCOUNTER — Other Ambulatory Visit: Payer: Self-pay

## 2020-12-01 ENCOUNTER — Ambulatory Visit: Payer: BLUE CROSS/BLUE SHIELD | Admitting: Family Medicine

## 2020-12-01 VITALS — BP 120/80 | HR 77 | Temp 98.2°F | Resp 18 | Ht 66.0 in | Wt 130.6 lb

## 2020-12-01 DIAGNOSIS — Z124 Encounter for screening for malignant neoplasm of cervix: Secondary | ICD-10-CM | POA: Insufficient documentation

## 2020-12-03 ENCOUNTER — Encounter: Payer: Self-pay | Admitting: Family Medicine

## 2020-12-03 LAB — CYTOLOGY - PAP
Comment: NEGATIVE
Diagnosis: NEGATIVE
High risk HPV: NEGATIVE

## 2020-12-05 ENCOUNTER — Ambulatory Visit
Admission: RE | Admit: 2020-12-05 | Discharge: 2020-12-05 | Disposition: A | Discharge status: Home / Self Care | Payer: BLUE CROSS/BLUE SHIELD | Source: Ambulatory Visit | Attending: Family Medicine | Admitting: Family Medicine

## 2020-12-05 ENCOUNTER — Other Ambulatory Visit: Payer: Self-pay | Admitting: Family Medicine

## 2020-12-05 DIAGNOSIS — R928 Other abnormal and inconclusive findings on diagnostic imaging of breast: Secondary | ICD-10-CM

## 2020-12-23 NOTE — Progress Notes (Deleted)
[facility] at [facility] [address], [address] [place], [place] [address] [phone]  Date:  12/29/2020   Name:  Meghan Travis   DOB:  30-Oct-1965   MRN:  [id number]  PCP:  Pearline Cables, MD    Chief Complaint: No chief complaint on file.   History of Present Illness:  Meghan Travis is a 55 y.o. very pleasant female patient who presents with the following:  Pt seen today for short term follow-up Last seen by myself 10/31-  History of pre-diabetes, cervical spine disease   Patient Active Problem List   Diagnosis Date Noted   Prediabetes 06/09/2020   Cervical spondylosis with myelopathy 02/19/2013   Herniated nucleus pulposus with myelopathy, cervical 01/13/2013    Past Medical History:  Diagnosis Date   Allergy    Headache(784.0)    miagrine-    Past Surgical History:  Procedure Laterality Date   ANTERIOR CERVICAL DECOMP/DISCECTOMY FUSION N/A 02/19/2013   Procedure: CERVICAL FOUR-FIVE,CERVICAL FIVE -SIX ANTERIOR CERVICAL DECOMPRESSION/FUSION;  Surgeon: Barnett Abu, MD;  Location: [facility];  Service: Neurosurgery;  Laterality: N/A;   CESAREAN SECTION     TUBAL LIGATION     tubal reveral       Social History   Tobacco Use   Smoking status: Never   Smokeless tobacco: Never  Substance Use Topics   Alcohol use: No    Alcohol/week: 0.0 standard drinks   Drug use: No    Family History  Problem Relation Age of Onset   Hyperlipidemia Mother    Hypertension Mother    Hyperlipidemia Father    Hypertension Father     No Known Allergies  Medication list has been reviewed and updated.  Current Outpatient Medications on File Prior to Visit  Medication Sig Dispense Refill   cyclobenzaprine (FLEXERIL) 10 MG tablet Take 1 tablet (10 mg total) by mouth at bedtime. Use as needed for muscle spasm 30 tablet 1   diclofenac (VOLTAREN) 75 MG EC tablet TAKE ONE TABLET BY MOUTH TWICE DAILY WITH FOOD AS NEEDED FOR PAIN 60  tablet 3   famotidine (PEPCID) 40 MG tablet Take 1 tablet (40 mg total) by mouth daily. Use as needed for stomach upset 90 tablet 3   gabapentin (NEURONTIN) 300 MG capsule 1 tab po at bedtime 1st day, 1 tablet bid second day, then 1 tablet tid (Patient taking differently: Take 300 mg by mouth daily as needed (pain).) 30 capsule 0   ipratropium (ATROVENT) 0.03 % nasal spray Place 2 sprays into the nose 4 (four) times daily. 30 mL 6   montelukast (SINGULAIR) 10 MG tablet Take 10 mg by mouth at bedtime.     traZODone (DESYREL) 50 MG tablet Take 0.5-1 tablets (25-50 mg total) by mouth at bedtime as needed for sleep. 30 tablet 5   No current facility-administered medications on file prior to visit.    Review of Systems:  As per HPI- otherwise negative.   Physical Examination: There were no vitals filed for this visit. There were no vitals filed for this visit. There is no height or weight on file to calculate BMI. Ideal Body Weight:    GEN: no acute distress. HEENT: Atraumatic, Normocephalic.  Ears and Nose: No external deformity. CV: RRR, No M/G/R. No JVD. No thrill. No extra heart sounds. PULM: CTA B, no wheezes, crackles, rhonchi. No retractions. No resp. distress. No accessory muscle use. ABD: S, NT, ND, +BS. No rebound. No HSM.  EXTR: No c/c/e PSYCH: Normally interactive. Conversant.    Assessment and Plan: ***  Signed Abbe Amsterdam, MD

## 2020-12-29 ENCOUNTER — Ambulatory Visit: Payer: BLUE CROSS/BLUE SHIELD | Admitting: Family Medicine

## 2021-06-05 ENCOUNTER — Ambulatory Visit
Admission: RE | Admit: 2021-06-05 | Discharge: 2021-06-05 | Disposition: A | Discharge status: Home / Self Care | Payer: BLUE CROSS/BLUE SHIELD | Source: Ambulatory Visit | Attending: Family Medicine | Admitting: Family Medicine

## 2021-06-05 DIAGNOSIS — R928 Other abnormal and inconclusive findings on diagnostic imaging of breast: Secondary | ICD-10-CM

## 2021-10-11 IMAGING — US US ABDOMEN LIMITED
1 series · 14 of 25 positions shown · non-contrast
Comparison: Abdominal CT 06/05/2015

CLINICAL DATA: Postprandial abdominal pain.  Rule out gallstones.

EXAM:
ULTRASOUND ABDOMEN LIMITED RIGHT UPPER QUADRANT

[Series 1: us abdomen limited · 14 of 103 slices shown]
[im 1/103]
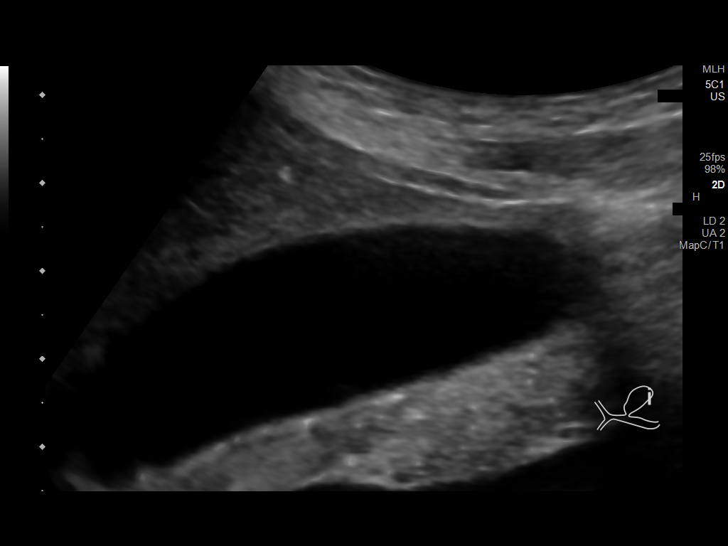
[im 9/103]
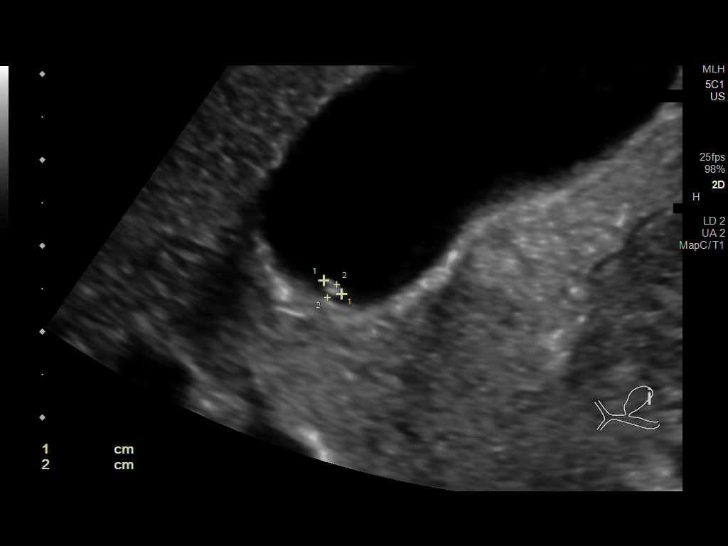
[im 18/103]
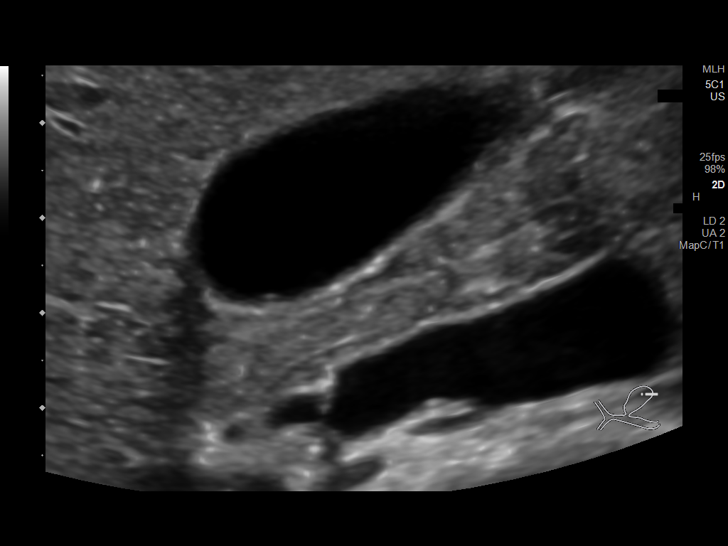
[im 26/103]
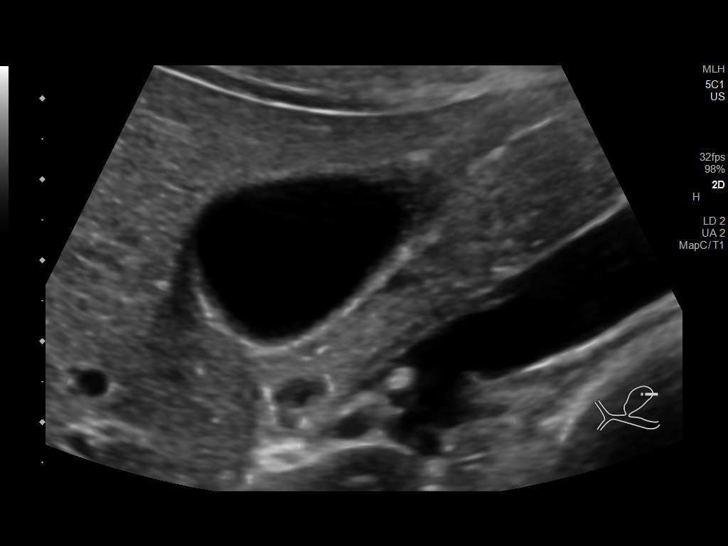
[im 35/103]
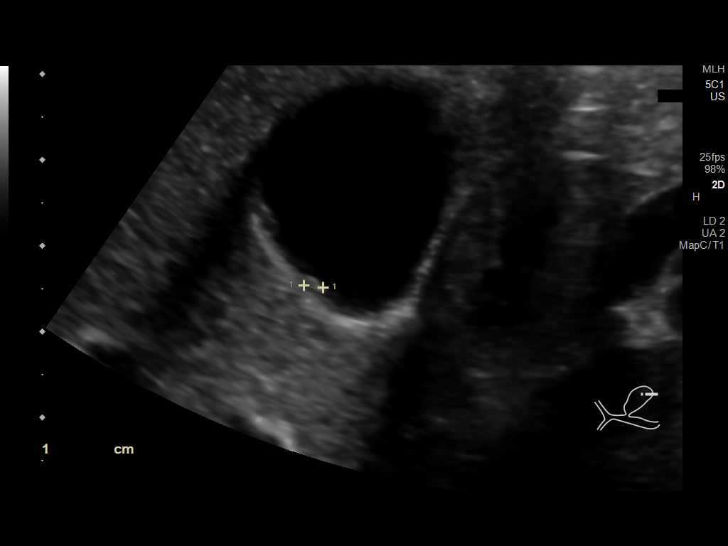
[im 39/103]
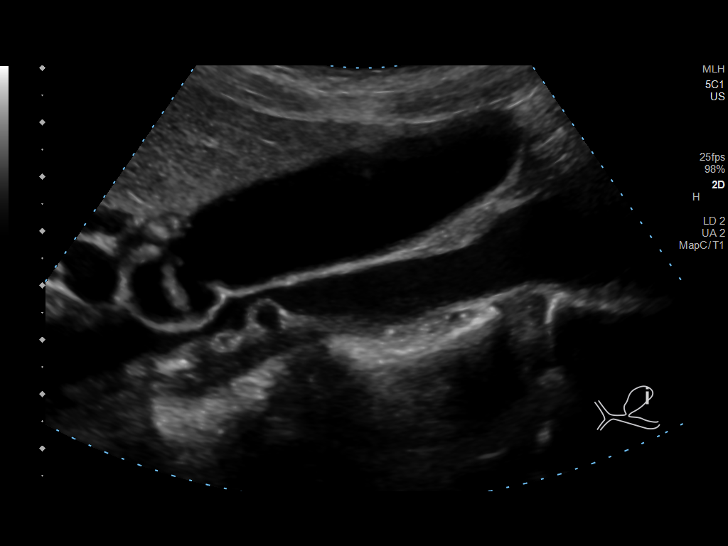
[im 47/103]
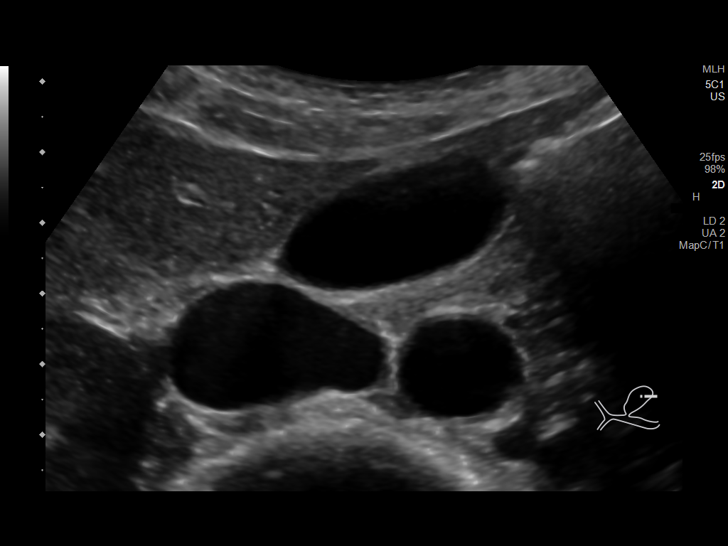
[im 56/103]
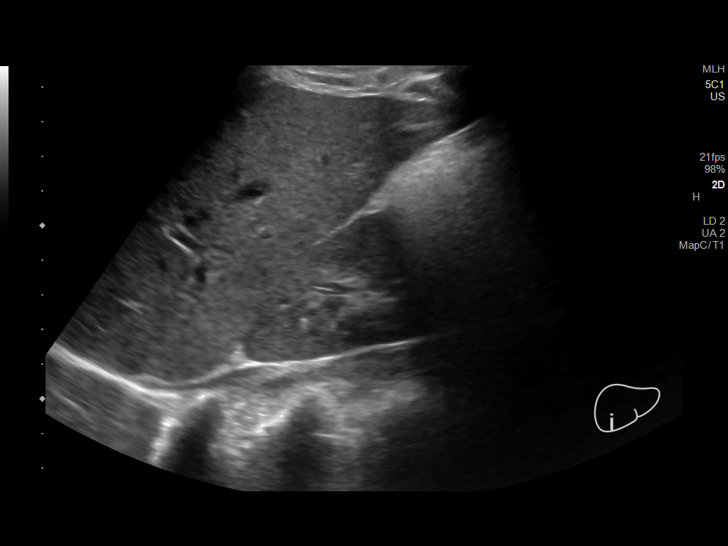
[im 64/103]
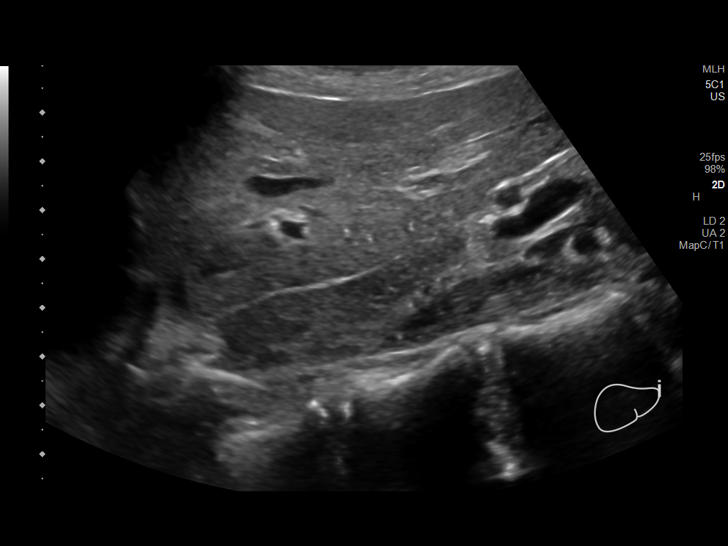
[im 69/103]
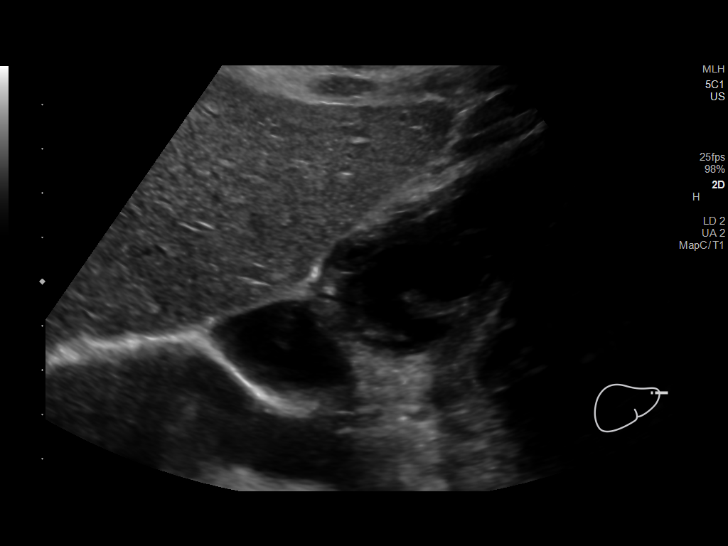
[im 77/103]
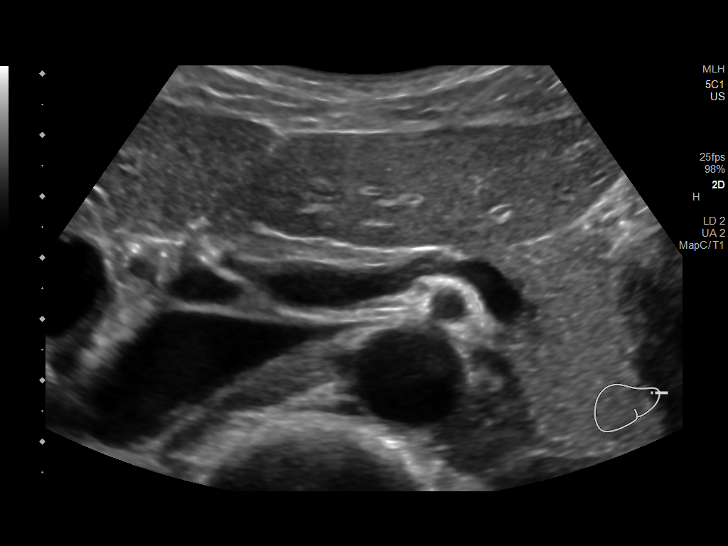
[im 86/103]
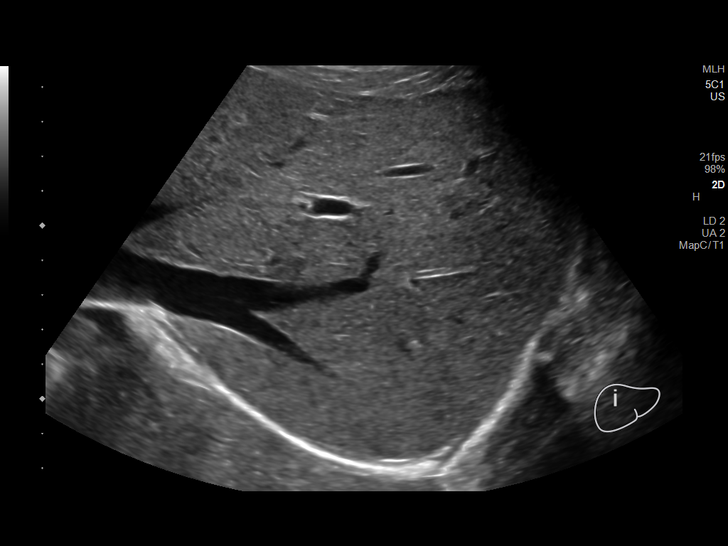
[im 94/103]
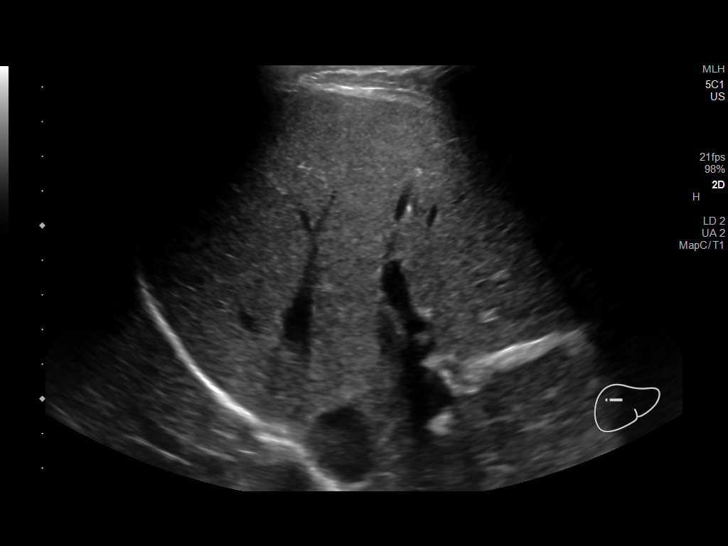
[im 103/103]
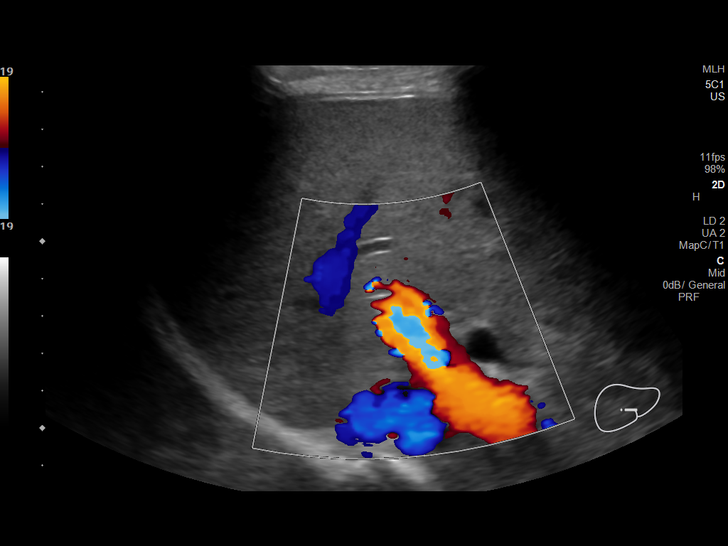

[14 of 25 positions shown; findings below may reference images not displayed]

FINDINGS: Gallbladder:

Physiologically distended. No intraluminal gallstones. Few small
adherent echogenic foci most consistent with polyps largest
measuring 3 mm. No internal vascularity. No sonographic Murphy sign
noted by sonographer.

Common bile duct:

Diameter: Dilated measuring 8 mm. There is some tapering distally.
No visualized choledocholithiasis.

Liver:

No sonographic correlate to the enhancing focus in the right lobe of
the liver on prior CT. Within normal limits in parenchymal
echogenicity. Portal vein is patent on color Doppler imaging with
normal direction of blood flow towards the liver.

Other: None.
IMPRESSION: 1. Small gallbladder polyps, largest measuring 3 mm. Given size of
polyps, no dedicated further imaging or follow-up is needed. No
gallstones.
2. Mildly dilated common bile duct at 8 mm without visualized
choledocholithiasis. This is slightly increased from 5449 CT.
Recommend correlation with liver enzymes. If elevated, recommend
further evaluation with MRCP.

## 2021-10-12 ENCOUNTER — Encounter: Payer: 59 | Admitting: Family Medicine

## 2021-10-30 NOTE — Progress Notes (Signed)
[facility] at [facility] [place], [place], [place] [address] [phone]  Date:  11/09/2021   Name:  Meghan Travis   DOB:  1965/07/28   MRN:  [id number]  PCP:  Darreld Mclean, MD    Chief Complaint: Annual Exam (Concerns/ questions: none/HV screen due/Flu shot today: yes/Shingrix: none)   History of Present Illness:  Meghan Travis is a 56 y.o. very pleasant female patient who presents with the following:  Meghan Travis is seen today for physical Most recent visit with myself was about 1 year ago, though I do see her frequently in the office as I care for one of her family members-her elderly mother  History of degenerative cervical disease, status post discectomy and fusion in 2015, prediabetes Can update lab work today- she is fasting today  Mammogram having today  Cologuard 2020, negative.  Needs update  Shingrix; recommended at her convenience, she does plan to at some point Recommend COVID-19 vaccine Flu shot- give today   She did separate from her husband a year ago- this has been a stress.  Admits she does feel sad some of the time.  Offered to try a medication for depression- she declines right now.  Encouraged her to consider therapy  She denies any suicidal ideation, she has a 54-year-old grandson who is a big positive in her life Patient Active Problem List   Diagnosis Date Noted   Prediabetes 06/09/2020   Cervical spondylosis with myelopathy 02/19/2013   Herniated nucleus pulposus with myelopathy, cervical 01/13/2013    Past Medical History:  Diagnosis Date   Allergy    Headache(784.0)    miagrine-    Past Surgical History:  Procedure Laterality Date   ANTERIOR CERVICAL DECOMP/DISCECTOMY FUSION N/A 02/19/2013   Procedure: CERVICAL FOUR-FIVE,CERVICAL FIVE -SIX ANTERIOR CERVICAL DECOMPRESSION/FUSION;  Surgeon: Kristeen Miss, MD;  Location: [facility];  Service: Neurosurgery;  Laterality: N/A;   CESAREAN  SECTION     TUBAL LIGATION     tubal reveral       Social History   Tobacco Use   Smoking status: Never   Smokeless tobacco: Never  Substance Use Topics   Alcohol use: No    Alcohol/week: 0.0 standard drinks of alcohol   Drug use: No    Family History  Problem Relation Age of Onset   Hyperlipidemia Mother    Hypertension Mother    Hyperlipidemia Father    Hypertension Father     No Known Allergies  Medication list has been reviewed and updated.  Current Outpatient Medications on File Prior to Visit  Medication Sig Dispense Refill   cyclobenzaprine (FLEXERIL) 10 MG tablet Take 1 tablet (10 mg total) by mouth at bedtime. Use as needed for muscle spasm 30 tablet 1   diclofenac (VOLTAREN) 75 MG EC tablet TAKE ONE TABLET BY MOUTH TWICE DAILY WITH FOOD AS NEEDED FOR PAIN 60 tablet 3   famotidine (PEPCID) 40 MG tablet Take 1 tablet (40 mg total) by mouth daily. Use as needed for stomach upset 90 tablet 3   gabapentin (NEURONTIN) 300 MG capsule 1 tab po at bedtime 1st day, 1 tablet bid second day, then 1 tablet tid (Patient taking differently: Take 300 mg by mouth daily as needed (pain).) 30 capsule 0   ipratropium (ATROVENT) 0.03 % nasal spray Place 2 sprays into the nose 4 (four) times daily. 30 mL 6   montelukast (SINGULAIR) 10 MG tablet Take 10 mg by  mouth at bedtime.     traZODone (DESYREL) 50 MG tablet Take 0.5-1 tablets (25-50 mg total) by mouth at bedtime as needed for sleep. 30 tablet 5   No current facility-administered medications on file prior to visit.    Review of Systems:  As per HPI- otherwise negative.   Physical Examination: Vitals:   11/09/21 0838  BP: 120/84  Pulse: 79  Resp: 18  Temp: 98.4 F (36.9 C)  SpO2: 99%   Vitals:   11/09/21 0838  Weight: 135 lb 6.4 oz (61.4 kg)  Height: 5\' 6"  (1.676 m)   Body mass index is 21.85 kg/m. Ideal Body Weight: Weight in (lb) to have BMI = 25: 154.6  GEN: no acute distress.  Slender build, looks well and  younger than age 18: Atraumatic, Normocephalic.  Bilateral TM wnl, oropharynx normal.  PEERL,EOMI.   Ears and Nose: No external deformity. CV: RRR, No M/G/R. No JVD. No thrill. No extra heart sounds. PULM: CTA B, no wheezes, crackles, rhonchi. No retractions. No resp. distress. No accessory muscle use. ABD: S, NT, ND. No rebound. No HSM. EXTR: No c/c/e PSYCH: Normally interactive. Conversant.    Assessment and Plan: Physical exam  Screening for hyperlipidemia - Plan: Lipid panel  Screening for diabetes mellitus - Plan: Comprehensive metabolic panel, Hemoglobin A1c  Fatigue, unspecified type - Plan: CBC, TSH, VITAMIN D 25 Hydroxy (Vit-D Deficiency, Fractures), B12  Screening for colon cancer - Plan: Cologuard  Other seasonal allergic rhinitis - Plan: ipratropium (ATROVENT) 0.03 % nasal spray  Herniated nucleus pulposus with myelopathy, cervical - Plan: gabapentin (NEURONTIN) 300 MG capsule  Physical exam today.  Encouraged healthy diet and exercise routine Will plan further follow- up pending labs. Flu today Cologuard ordered Recommend shingles and covid booster this fall  Discussed stress and marital separation, encouraged her to see a counselor.  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 Results for orders placed or performed in visit on 11/09/21  CBC  Result Value Ref Range   WBC 2.2 (L) 4.0 - 10.5 K/uL   RBC 4.08 3.87 - 5.11 Mil/uL   Platelets 200.0 150.0 - 400.0 K/uL   Hemoglobin 13.0 12.0 - 15.0 g/dL   HCT 38.8 36.0 - 46.0 %   MCV 95.1 78.0 - 100.0 fl   MCHC 33.4 30.0 - 36.0 g/dL   RDW 13.4 11.5 - 15.5 %  Comprehensive metabolic panel  Result Value Ref Range   Sodium 143 135 - 145 mEq/L   Potassium 4.6 3.5 - 5.1 mEq/L   Chloride 107  96 - 112 mEq/L   CO2 28 19 - 32 mEq/L   Glucose, Bld 84 70 - 99 mg/dL   BUN 20 6 - 23 mg/dL   Creatinine, Ser 0.75 0.40 - 1.20 mg/dL   Total Bilirubin 0.7 0.2 - 1.2 mg/dL   Alkaline Phosphatase 114 39 - 117 U/L   AST 24 0 - 37 U/L   ALT 14 0 - 35 U/L   Total Protein 7.1 6.0 - 8.3 g/dL   Albumin 4.3 3.5 - 5.2 g/dL   GFR 89.55 >60.00 mL/min   Calcium 9.8 8.4 - 10.5 mg/dL  Hemoglobin A1c  Result Value Ref Range   Hgb A1c MFr Bld 5.9 4.6 - 6.5 %  Lipid panel  Result Value Ref Range   Cholesterol 169 0 - 200 mg/dL   Triglycerides 34.0 0.0 - 149.0 mg/dL   HDL 72.10 >39.00 mg/dL   VLDL 6.8 0.0 - 40.0 mg/dL   LDL Cholesterol 90 0 - 99 mg/dL   Total CHOL/HDL Ratio 2    NonHDL 96.94   TSH  Result Value Ref Range   TSH 0.74 0.35 - 5.50 uIU/mL  VITAMIN D 25 Hydroxy (Vit-D Deficiency, Fractures)  Result Value Ref Range   VITD 25.72 (L) 30.00 - 100.00 ng/mL  B12  Result Value Ref Range   Vitamin B-12 513 211 - 911 pg/mL

## 2021-10-30 NOTE — Patient Instructions (Addendum)
It was great to see you again today, I will be in touch with your labs as soon as possible Recommend getting updated COVID shot this fall as well as the shingles series at your convenience   Assuming all is ok please see me in 6- 12 months

## 2021-11-09 ENCOUNTER — Encounter: Payer: Self-pay | Admitting: Family Medicine

## 2021-11-09 ENCOUNTER — Ambulatory Visit (INDEPENDENT_AMBULATORY_CARE_PROVIDER_SITE_OTHER): Payer: 59 | Admitting: Family Medicine

## 2021-11-09 VITALS — BP 120/84 | HR 79 | Temp 98.4°F | Resp 18 | Ht 66.0 in | Wt 135.4 lb

## 2021-11-09 DIAGNOSIS — Z13220 Encounter for screening for lipoid disorders: Secondary | ICD-10-CM

## 2021-11-09 DIAGNOSIS — Z1211 Encounter for screening for malignant neoplasm of colon: Secondary | ICD-10-CM

## 2021-11-09 DIAGNOSIS — Z0000 Encounter for general adult medical examination without abnormal findings: Secondary | ICD-10-CM | POA: Diagnosis not present

## 2021-11-09 DIAGNOSIS — Z131 Encounter for screening for diabetes mellitus: Secondary | ICD-10-CM | POA: Diagnosis not present

## 2021-11-09 DIAGNOSIS — R5383 Other fatigue: Secondary | ICD-10-CM

## 2021-11-09 DIAGNOSIS — Z23 Encounter for immunization: Secondary | ICD-10-CM | POA: Diagnosis not present

## 2021-11-09 DIAGNOSIS — D72819 Decreased white blood cell count, unspecified: Secondary | ICD-10-CM

## 2021-11-09 DIAGNOSIS — M5000 Cervical disc disorder with myelopathy, unspecified cervical region: Secondary | ICD-10-CM

## 2021-11-09 DIAGNOSIS — J302 Other seasonal allergic rhinitis: Secondary | ICD-10-CM

## 2021-11-09 LAB — COMPREHENSIVE METABOLIC PANEL
ALT: 14 U/L (ref 0–35)
AST: 24 U/L (ref 0–37)
Albumin: 4.3 g/dL (ref 3.5–5.2)
Alkaline Phosphatase: 114 U/L (ref 39–117)
BUN: 20 mg/dL (ref 6–23)
CO2: 28 mEq/L (ref 19–32)
Calcium: 9.8 mg/dL (ref 8.4–10.5)
Chloride: 107 mEq/L (ref 96–112)
Creatinine, Ser: 0.75 mg/dL (ref 0.40–1.20)
GFR: 89.55 mL/min (ref >60.00)
Glucose, Bld: 84 mg/dL (ref 70–99)
Potassium: 4.6 mEq/L (ref 3.5–5.1)
Sodium: 143 mEq/L (ref 135–145)
Total Bilirubin: 0.7 mg/dL (ref 0.2–1.2)
Total Protein: 7.1 g/dL (ref 6.0–8.3)

## 2021-11-09 LAB — CBC
HCT: 38.8 % (ref 36.0–46.0)
Hemoglobin: 13 g/dL (ref 12.0–15.0)
MCHC: 33.4 g/dL (ref 30.0–36.0)
MCV: 95.1 fl (ref 78.0–100.0)
Platelets: 200 10*3/uL (ref 150.0–400.0)
RBC: 4.08 Mil/uL (ref 3.87–5.11)
RDW: 13.4 % (ref 11.5–15.5)
WBC: 2.2 10*3/uL — ABNORMAL LOW (ref 4.0–10.5)

## 2021-11-09 LAB — VITAMIN D 25 HYDROXY (VIT D DEFICIENCY, FRACTURES): VITD: 25.72 ng/mL — ABNORMAL LOW (ref 30.00–100.00)

## 2021-11-09 LAB — HEMOGLOBIN A1C: Hgb A1c MFr Bld: 5.9 % (ref 4.6–6.5)

## 2021-11-09 LAB — TSH: TSH: 0.74 u[IU]/mL (ref 0.35–5.50)

## 2021-11-09 LAB — LIPID PANEL
Cholesterol: 169 mg/dL (ref 0–200)
HDL: 72.1 mg/dL (ref >39.00)
LDL Cholesterol: 90 mg/dL (ref 0–99)
NonHDL: 96.94
Total CHOL/HDL Ratio: 2
Triglycerides: 34 mg/dL (ref 0.0–149.0)
VLDL: 6.8 mg/dL (ref 0.0–40.0)

## 2021-11-09 LAB — VITAMIN B12: Vitamin B-12: 513 pg/mL (ref 211–911)

## 2021-11-09 MED ORDER — GABAPENTIN 300 MG PO CAPS: ORAL_CAPSULE | ORAL | 3 refills | Status: DC

## 2021-11-09 MED ORDER — IPRATROPIUM BROMIDE 0.03 % NA SOLN: 2.0000 | Freq: Four times a day (QID) | NASAL | 6 refills | Status: AC

## 2021-12-02 ENCOUNTER — Encounter: Payer: Self-pay | Admitting: Family Medicine

## 2021-12-02 LAB — COLOGUARD: COLOGUARD: NEGATIVE

## 2021-12-18 ENCOUNTER — Encounter: Payer: Self-pay | Admitting: Family Medicine

## 2021-12-18 ENCOUNTER — Other Ambulatory Visit (INDEPENDENT_AMBULATORY_CARE_PROVIDER_SITE_OTHER): Payer: 59

## 2021-12-18 DIAGNOSIS — D72819 Decreased white blood cell count, unspecified: Secondary | ICD-10-CM

## 2021-12-18 LAB — CBC WITH DIFFERENTIAL/PLATELET
Basophils Absolute: 0 10*3/uL (ref 0.0–0.1)
Basophils Relative: 0.8 % (ref 0.0–3.0)
Eosinophils Absolute: 0.1 10*3/uL (ref 0.0–0.7)
Eosinophils Relative: 2.5 % (ref 0.0–5.0)
HCT: 38.4 % (ref 36.0–46.0)
Hemoglobin: 12.9 g/dL (ref 12.0–15.0)
Lymphocytes Relative: 36.8 % (ref 12.0–46.0)
Lymphs Abs: 1.2 10*3/uL (ref 0.7–4.0)
MCHC: 33.6 g/dL (ref 30.0–36.0)
MCV: 94.3 fl (ref 78.0–100.0)
Monocytes Absolute: 0.2 10*3/uL (ref 0.1–1.0)
Monocytes Relative: 7.4 % (ref 3.0–12.0)
Neutro Abs: 1.7 10*3/uL (ref 1.4–7.7)
Neutrophils Relative %: 52.5 % (ref 43.0–77.0)
Platelets: 200 10*3/uL (ref 150.0–400.0)
RBC: 4.07 Mil/uL (ref 3.87–5.11)
RDW: 13.3 % (ref 11.5–15.5)
WBC: 3.2 10*3/uL — ABNORMAL LOW (ref 4.0–10.5)

## 2022-05-19 ENCOUNTER — Other Ambulatory Visit: Payer: Self-pay | Admitting: Family Medicine

## 2022-05-19 DIAGNOSIS — N6320 Unspecified lump in the left breast, unspecified quadrant: Secondary | ICD-10-CM

## 2022-06-14 ENCOUNTER — Ambulatory Visit
Admission: RE | Admit: 2022-06-14 | Discharge: 2022-06-14 | Disposition: A | Discharge status: Home / Self Care | Payer: 59 | Source: Ambulatory Visit | Attending: Family Medicine | Admitting: Family Medicine

## 2022-06-14 DIAGNOSIS — N6320 Unspecified lump in the left breast, unspecified quadrant: Secondary | ICD-10-CM

## 2022-08-08 IMAGING — MG MM DIGITAL DIAGNOSTIC UNILAT*L* W/ TOMO W/ CAD
4 series · 4 of 12 positions shown · non-contrast
Comparison: Previous exam(s).

CLINICAL DATA: 55-year-old female for further evaluation of
possible LEFT breast asymmetry on screening mammogram.

EXAM:
DIGITAL DIAGNOSTIC UNILATERAL LEFT MAMMOGRAM WITH TOMOSYNTHESIS AND
CAD; ULTRASOUND LEFT BREAST LIMITED
TECHNIQUE: Left digital diagnostic mammography and breast tomosynthesis was
performed. The images were evaluated with computer-aided detection.;
Targeted ultrasound examination of the left breast was performed

[L CC synth-2D]
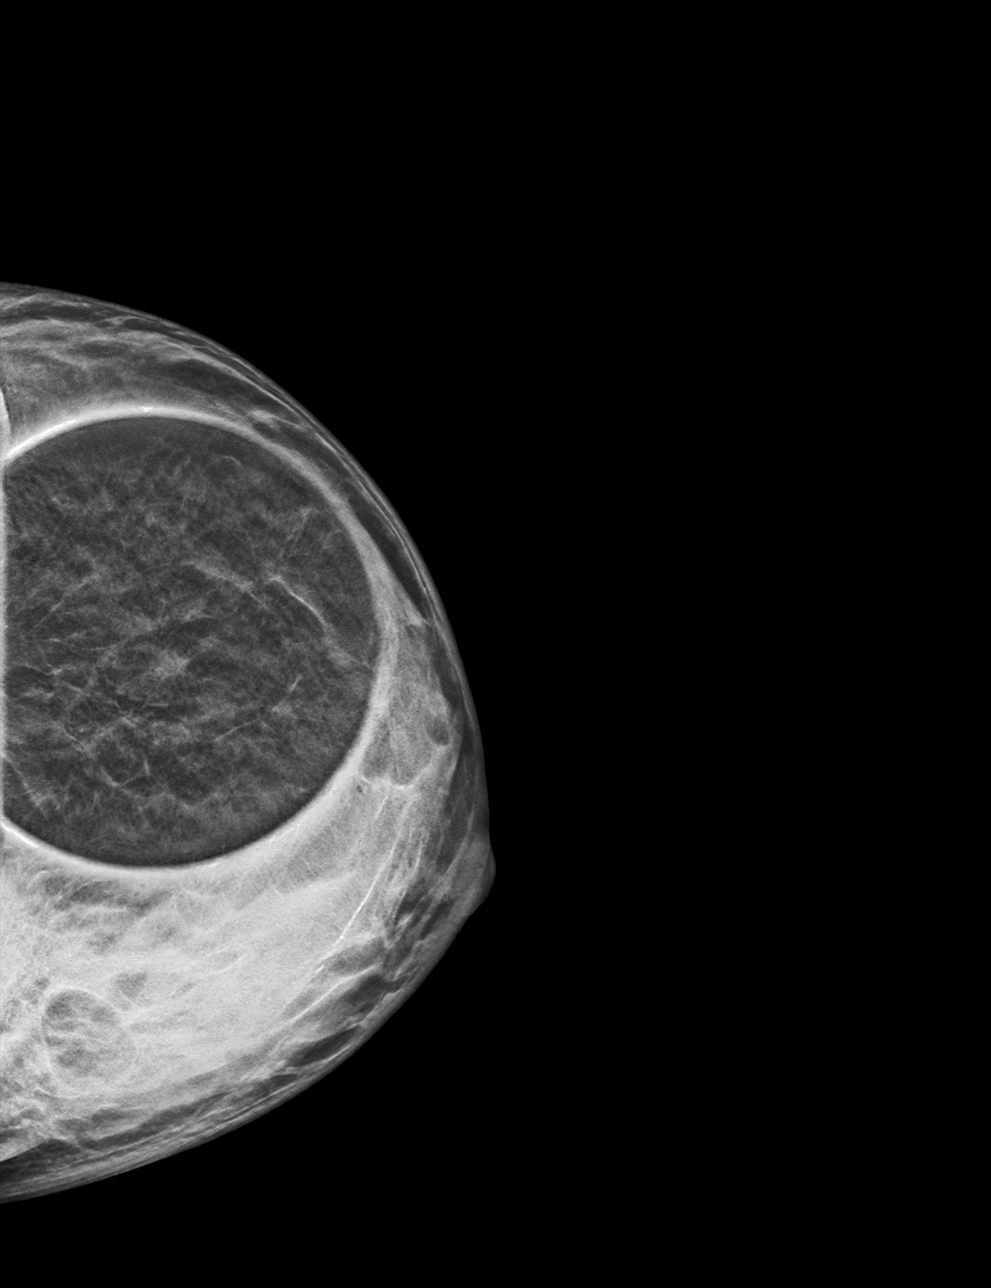

[L ML synth-2D]
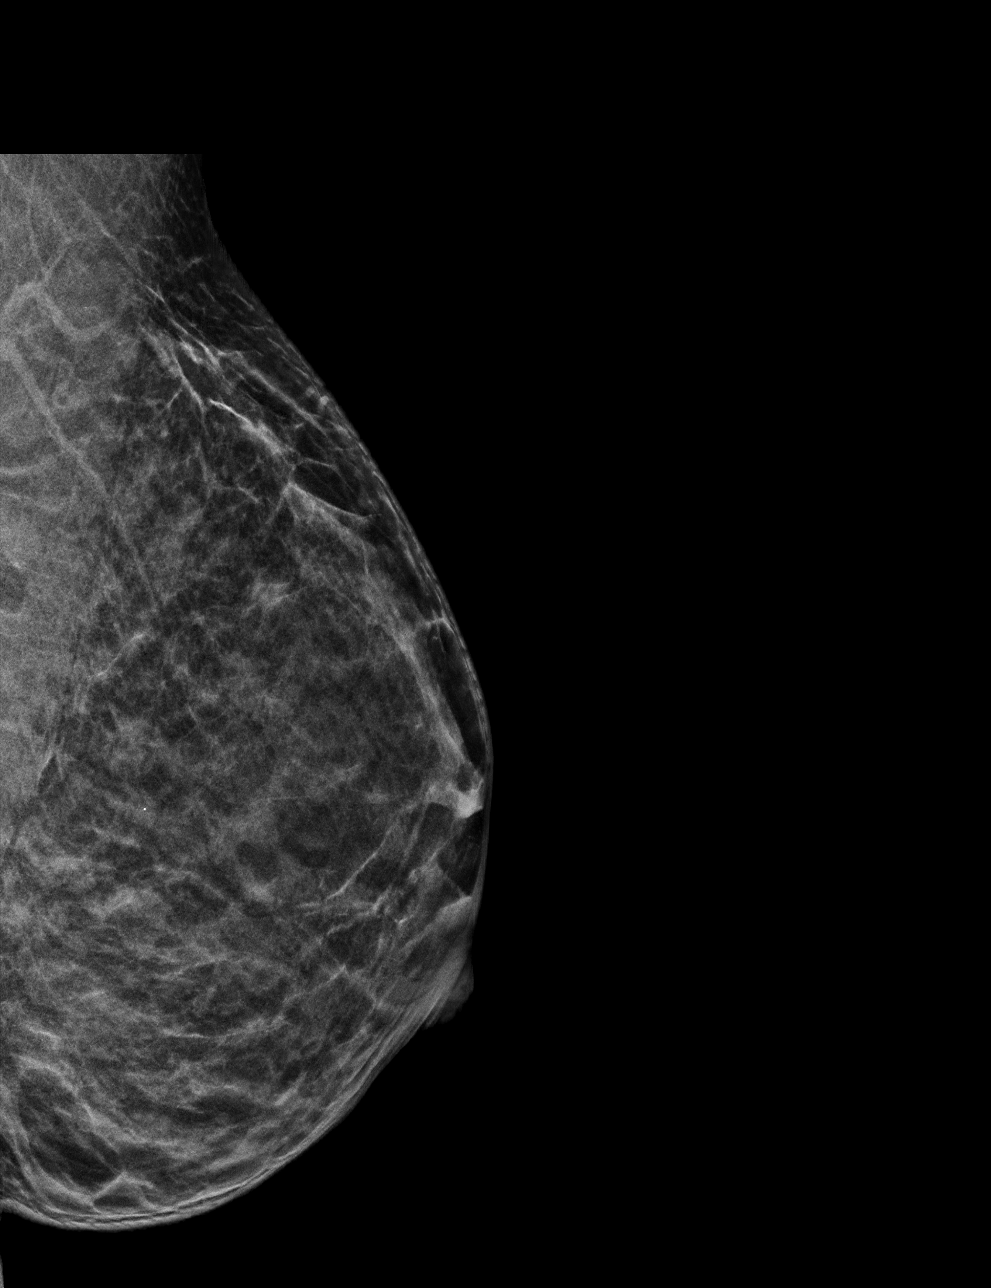

[L CC tomo · tomo slice 15/28.0]
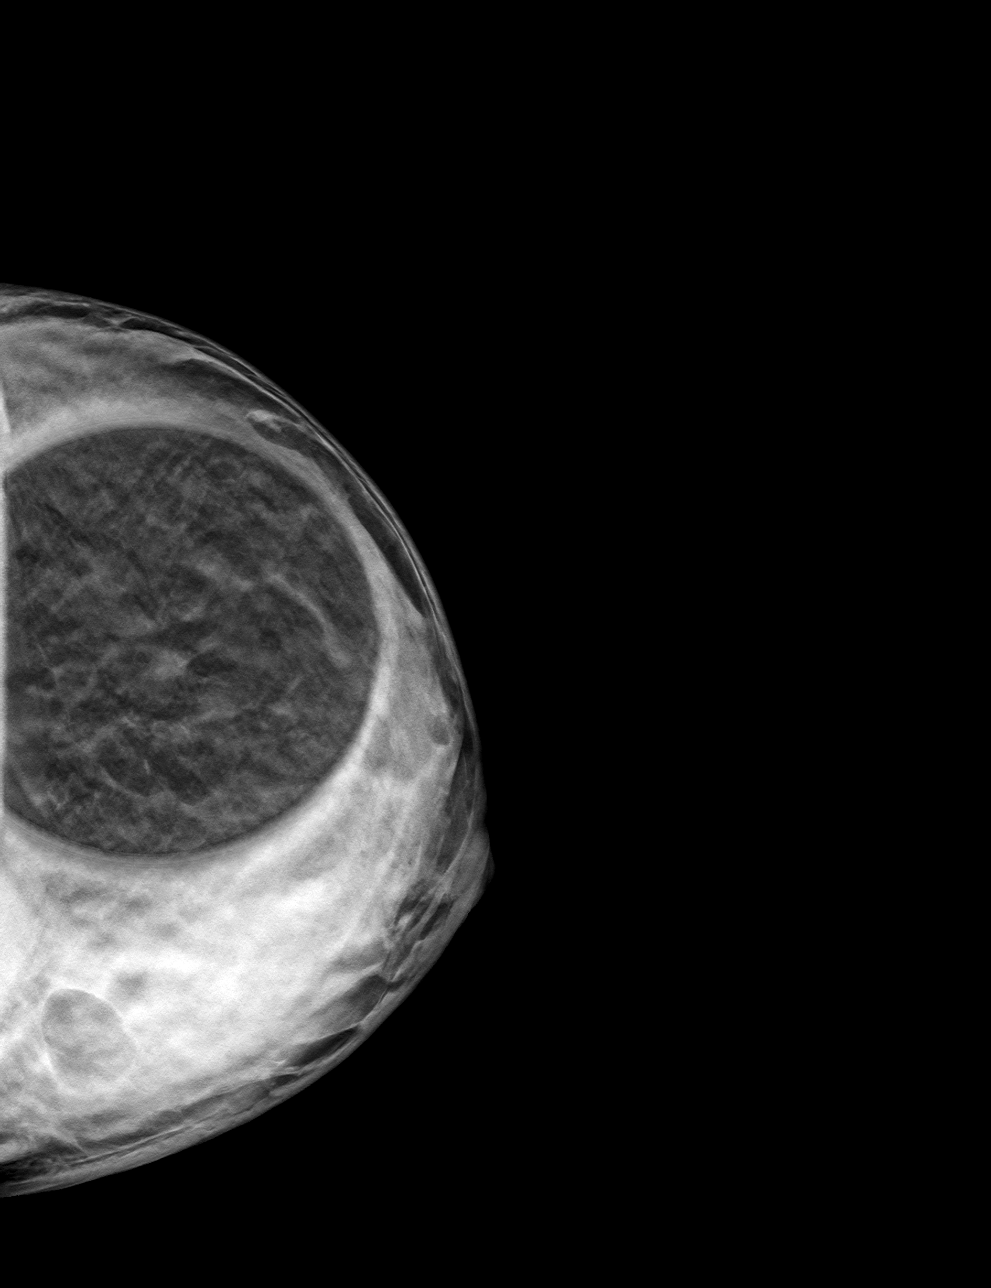

[L ML tomo · tomo slice 19/37.0]
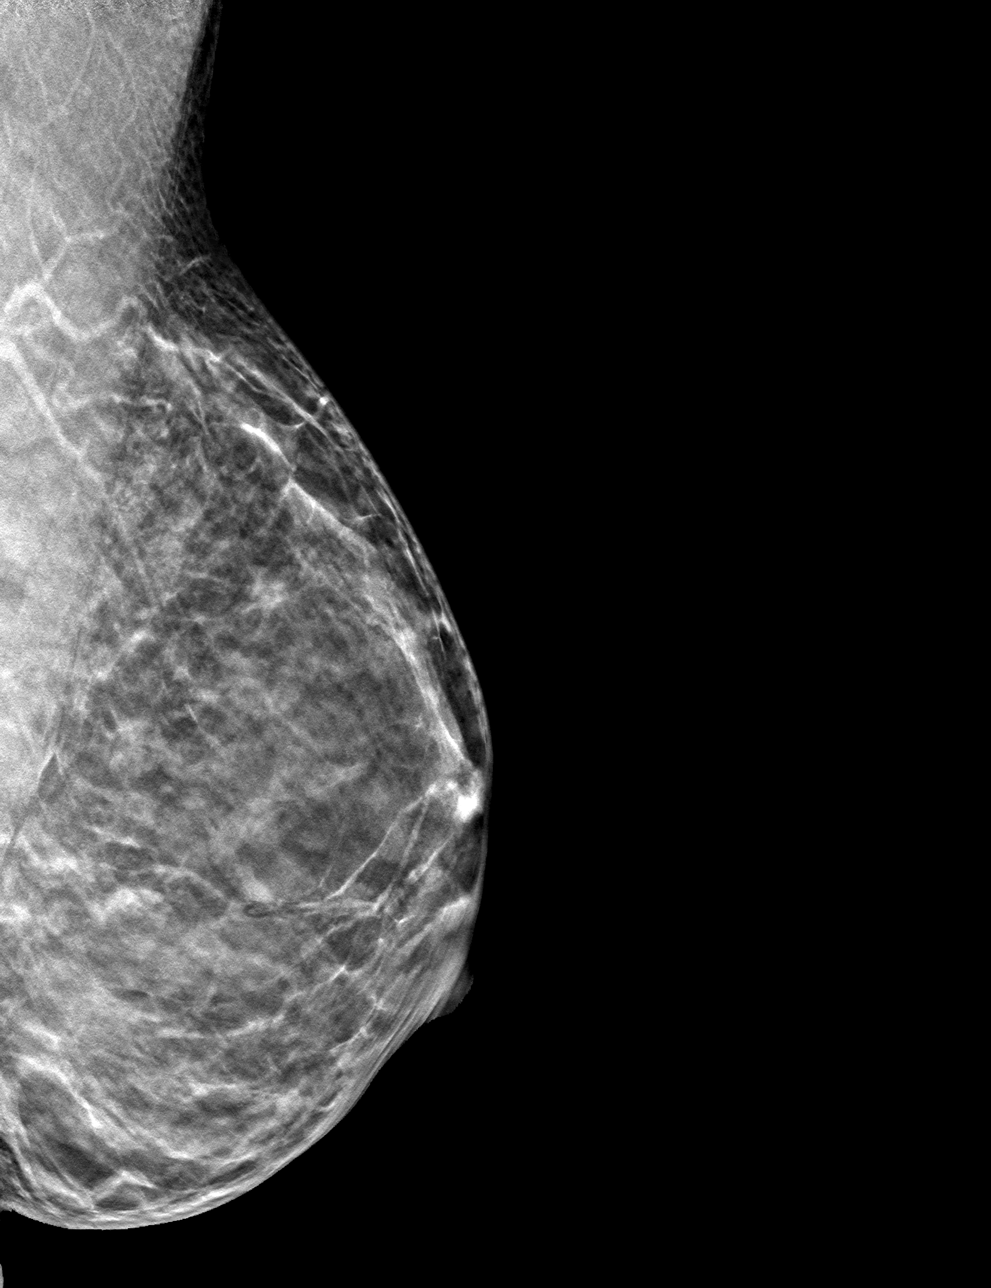

[4 of 12 positions shown; findings below may reference images not displayed]

ACR Breast Density Category d: The breast tissue is extremely dense,
which lowers the sensitivity of mammography.
FINDINGS: 2D/3D full field and spot compression views of the LEFT breast
demonstrate a persistent circumscribed oval hypoechoic mass within
the UPPER-OUTER LEFT breast, middle depth.

Targeted ultrasound is performed, showing a 0.8 x 0.2 x 0.8 cm
circumscribed oval hypoechoic parallel mass at the 2 o'clock
position of the LEFT breast 6 cm from the nipple corresponding to
the screening study finding.
IMPRESSION: 0.8 cm likely benign mass within the UPPER-OUTER LEFT breast
corresponding to the screening study finding. We discussed
management options including ultrasound-guided core biopsy, and
close follow-up. Follow-up ultrasound is recommended at 6, 12,and 24
months to assess stability. The patient concurs with this plan.

RECOMMENDATION:
LEFT breast ultrasound in 6 months.

I have discussed the findings and recommendations with the patient.
If applicable, a reminder letter will be sent to the patient
regarding the next appointment.

BI-RADS CATEGORY  3: Probably benign.

## 2022-12-03 NOTE — Patient Instructions (Incomplete)
It was great to see you again today, I will be in touch with your lab work If not done already recommend COVID booster, flu shot Also recommend you take care of this shingles series at your earliest convenience!  First dose of shingles, flu done today 2nd dose shingles can be done in 2-6 months as a nurse visit only  Let me know if you want to look further at your right hamstring pain

## 2022-12-03 NOTE — Progress Notes (Unsigned)
[facility] [address], [address] [place], [place] [address] [phone]  Date:  12/06/2022   Name:  Meghan Travis   DOB:  28-Feb-1965   MRN:  [id number]  PCP:  Pearline Cables, MD    Chief Complaint: No chief complaint on file.   History of Present Illness:  Meghan Travis is a 58 y.o. very pleasant female patient who presents with the following:  Patient seen today for physical exam History of degenerative cervical disease, status post discectomy and fusion in 2015, prediabetes, vitamin D deficiency  Most recent visit with myself was in October of last year-also for her physical At that time she had separated from her husband about a year prior, this was a stressor.  However, at that time she did not wish to go on medication for depression.  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  This has been stable for a decade  Patient Active Problem List   Diagnosis Date Noted   Prediabetes 06/09/2020   Cervical spondylosis with myelopathy 02/19/2013   Herniated nucleus pulposus with myelopathy, cervical 01/13/2013    Past Medical History:  Diagnosis Date   Allergy    Headache(784.0)    miagrine-    Past Surgical History:  Procedure Laterality Date   ANTERIOR CERVICAL DECOMP/DISCECTOMY FUSION N/A 02/19/2013   Procedure: CERVICAL FOUR-FIVE,CERVICAL FIVE -SIX ANTERIOR CERVICAL DECOMPRESSION/FUSION;  Surgeon: Barnett Abu, MD;  Location: [facility];  Service: Neurosurgery;  Laterality: N/A;   CESAREAN SECTION     TUBAL LIGATION     tubal reveral       Social History   Tobacco Use   Smoking status: Never   Smokeless tobacco: Never  Substance  Use Topics   Alcohol use: No    Alcohol/week: 0.0 standard drinks of alcohol   Drug use: No    Family History  Problem Relation Age of Onset   Hyperlipidemia Mother    Hypertension Mother    Hyperlipidemia Father    Hypertension Father     No Known Allergies  Medication list has been reviewed and updated.  Current Outpatient Medications on File Prior to Visit  Medication Sig Dispense Refill   cyclobenzaprine (FLEXERIL) 10 MG tablet Take 1 tablet (10 mg total) by mouth at bedtime. Use as needed for muscle spasm 30 tablet 1   diclofenac (VOLTAREN) 75 MG EC tablet TAKE ONE TABLET BY MOUTH TWICE DAILY WITH FOOD AS NEEDED FOR PAIN 60 tablet 3   famotidine (PEPCID) 40 MG tablet Take 1 tablet (40 mg total) by mouth daily. Use as needed for stomach upset 90 tablet 3   gabapentin (NEURONTIN) 300 MG capsule 1 tab po at bedtime prn 90 capsule 3   ipratropium (ATROVENT) 0.03 % nasal spray Place 2 sprays into the nose 4 (four) times daily. 30 mL 6   montelukast (SINGULAIR) 10 MG tablet Take 10 mg by mouth at bedtime.     traZODone (DESYREL) 50 MG tablet Take 0.5-1 tablets (25-50 mg total) by mouth at bedtime as needed for sleep. 30 tablet 5   No current facility-administered medications on file prior to visit.    Review of Systems:  As per HPI- otherwise negative.  Physical Examination: There were no vitals filed for this visit. There were no vitals filed for this visit. There is no height or weight on file to calculate BMI. Ideal Body Weight:    GEN: no acute distress. HEENT: Atraumatic, Normocephalic.  Ears and Nose: No external deformity. CV: RRR, No M/G/R. No JVD. No thrill. No extra heart sounds. PULM: CTA B, no wheezes, crackles, rhonchi. No retractions. No resp. distress. No accessory muscle use. ABD: S, NT, ND, +BS. No rebound. No HSM. EXTR: No c/c/e PSYCH: Normally interactive. Conversant.    Assessment and Plan: *** Physical exam today.  Encouraged healthy diet and  exercise routine Will plan further follow- up pending labs.  Signed Abbe Amsterdam, MD

## 2022-12-06 ENCOUNTER — Ambulatory Visit (INDEPENDENT_AMBULATORY_CARE_PROVIDER_SITE_OTHER): Payer: 59 | Admitting: Family Medicine

## 2022-12-06 ENCOUNTER — Encounter: Payer: Self-pay | Admitting: Family Medicine

## 2022-12-06 VITALS — BP 124/62 | HR 85 | Temp 98.1°F | Resp 18 | Ht 66.0 in | Wt 131.0 lb

## 2022-12-06 DIAGNOSIS — Z0000 Encounter for general adult medical examination without abnormal findings: Secondary | ICD-10-CM

## 2022-12-06 DIAGNOSIS — F5102 Adjustment insomnia: Secondary | ICD-10-CM

## 2022-12-06 DIAGNOSIS — G8929 Other chronic pain: Secondary | ICD-10-CM

## 2022-12-06 DIAGNOSIS — M5000 Cervical disc disorder with myelopathy, unspecified cervical region: Secondary | ICD-10-CM

## 2022-12-06 DIAGNOSIS — Z1329 Encounter for screening for other suspected endocrine disorder: Secondary | ICD-10-CM | POA: Diagnosis not present

## 2022-12-06 DIAGNOSIS — Z23 Encounter for immunization: Secondary | ICD-10-CM

## 2022-12-06 DIAGNOSIS — Z13220 Encounter for screening for lipoid disorders: Secondary | ICD-10-CM

## 2022-12-06 DIAGNOSIS — R7303 Prediabetes: Secondary | ICD-10-CM

## 2022-12-06 DIAGNOSIS — D72819 Decreased white blood cell count, unspecified: Secondary | ICD-10-CM | POA: Diagnosis not present

## 2022-12-06 DIAGNOSIS — E559 Vitamin D deficiency, unspecified: Secondary | ICD-10-CM | POA: Diagnosis not present

## 2022-12-06 DIAGNOSIS — M542 Cervicalgia: Secondary | ICD-10-CM

## 2022-12-06 DIAGNOSIS — M549 Dorsalgia, unspecified: Secondary | ICD-10-CM

## 2022-12-06 LAB — COMPREHENSIVE METABOLIC PANEL
ALT: 13 U/L (ref 0–35)
AST: 20 U/L (ref 0–37)
Albumin: 4.6 g/dL (ref 3.5–5.2)
Alkaline Phosphatase: 113 U/L (ref 39–117)
BUN: 19 mg/dL (ref 6–23)
CO2: 29 meq/L (ref 19–32)
Calcium: 10 mg/dL (ref 8.4–10.5)
Chloride: 104 meq/L (ref 96–112)
Creatinine, Ser: 0.72 mg/dL (ref 0.40–1.20)
GFR: 93.34 mL/min (ref >60.00)
Glucose, Bld: 81 mg/dL (ref 70–99)
Potassium: 4.5 meq/L (ref 3.5–5.1)
Sodium: 140 meq/L (ref 135–145)
Total Bilirubin: 0.7 mg/dL (ref 0.2–1.2)
Total Protein: 7.4 g/dL (ref 6.0–8.3)

## 2022-12-06 LAB — TSH: TSH: 0.65 u[IU]/mL (ref 0.35–5.50)

## 2022-12-06 LAB — LIPID PANEL
Cholesterol: 186 mg/dL (ref 0–200)
HDL: 81.4 mg/dL (ref >39.00)
LDL Cholesterol: 98 mg/dL (ref 0–99)
NonHDL: 105.01
Total CHOL/HDL Ratio: 2
Triglycerides: 33 mg/dL (ref 0.0–149.0)
VLDL: 6.6 mg/dL (ref 0.0–40.0)

## 2022-12-06 LAB — CBC
HCT: 40.7 % (ref 36.0–46.0)
Hemoglobin: 13.2 g/dL (ref 12.0–15.0)
MCHC: 32.6 g/dL (ref 30.0–36.0)
MCV: 96 fL (ref 78.0–100.0)
Platelets: 229 10*3/uL (ref 150.0–400.0)
RBC: 4.24 Mil/uL (ref 3.87–5.11)
RDW: 13.8 % (ref 11.5–15.5)
WBC: 2.7 10*3/uL — ABNORMAL LOW (ref 4.0–10.5)

## 2022-12-06 LAB — HEMOGLOBIN A1C: Hgb A1c MFr Bld: 5.9 % (ref 4.6–6.5)

## 2022-12-06 LAB — VITAMIN D 25 HYDROXY (VIT D DEFICIENCY, FRACTURES): VITD: 41.48 ng/mL (ref 30.00–100.00)

## 2022-12-06 MED ORDER — CYCLOBENZAPRINE HCL 10 MG PO TABS: 10.0000 mg | ORAL_TABLET | Freq: Every day | ORAL | 1 refills | Status: DC

## 2022-12-06 MED ORDER — TRAZODONE HCL 50 MG PO TABS: 25.0000 mg | ORAL_TABLET | Freq: Every evening | ORAL | 5 refills | Status: AC | PRN

## 2022-12-06 MED ORDER — GABAPENTIN 300 MG PO CAPS: ORAL_CAPSULE | ORAL | 1 refills | Status: AC

## 2022-12-06 MED ORDER — DICLOFENAC SODIUM 75 MG PO TBEC: DELAYED_RELEASE_TABLET | ORAL | 3 refills | Status: DC

## 2022-12-08 ENCOUNTER — Other Ambulatory Visit: Payer: Self-pay | Admitting: Family Medicine

## 2022-12-08 DIAGNOSIS — D72819 Decreased white blood cell count, unspecified: Secondary | ICD-10-CM

## 2022-12-08 NOTE — Progress Notes (Signed)
Called and spoke with patient.  Went over her cholesterol.  Discussed her leukopenia, recommended having her see hematology.  She declines for now, notes this is a long-term finding.  She does agree to have a repeat CBC in about 2 months, I will order a pathology smear review at the same time

## 2023-06-07 ENCOUNTER — Ambulatory Visit (INDEPENDENT_AMBULATORY_CARE_PROVIDER_SITE_OTHER): Admitting: Physician Assistant

## 2023-06-07 ENCOUNTER — Ambulatory Visit: Payer: Self-pay

## 2023-06-07 ENCOUNTER — Encounter: Payer: Self-pay | Admitting: Physician Assistant

## 2023-06-07 VITALS — BP 135/84 | HR 89 | Ht 66.0 in | Wt 131.8 lb

## 2023-06-07 DIAGNOSIS — R0789 Other chest pain: Secondary | ICD-10-CM

## 2023-06-07 DIAGNOSIS — M79602 Pain in left arm: Secondary | ICD-10-CM | POA: Diagnosis not present

## 2023-06-07 MED ORDER — KETOROLAC TROMETHAMINE 10 MG PO TABS: 10.0000 mg | ORAL_TABLET | Freq: Four times a day (QID) | ORAL | 0 refills | Status: DC | PRN

## 2023-06-07 MED ORDER — DIAZEPAM 2 MG PO TABS: 2.0000 mg | ORAL_TABLET | Freq: Three times a day (TID) | ORAL | 0 refills | Status: AC | PRN

## 2023-06-07 MED ORDER — KETOROLAC TROMETHAMINE 30 MG/ML IJ SOLN
30.0000 mg | Freq: Once | INTRAMUSCULAR | Status: AC
  Administered 2023-06-07: 30 mg via INTRAVENOUS

## 2023-06-07 NOTE — Progress Notes (Signed)
 Established patient visit   Patient: Meghan Travis   DOB: 1965/12/20   58 y.o. Female  MRN: [id number] Visit Date: 06/07/2023  Today's healthcare provider: Trenton Frock, PA-C   Cc. Left arm, chest, back pain  Subjective     Discussed the use of AI scribe software for clinical note transcription with the patient, who gave verbal consent to proceed.  History of Present Illness   Meghan Travis is a 58 year old female who presents with persistent left arm pain and numbness. This occurred after a particularly strenuous clean, she cleans houses for a living.  She has experienced persistent pain and numbness in her left arm for the past month, following significant physical effort while cleaning. The pain has progressively worsened and is accompanied by muscle spasms, twitching, and numbness in her hand. The pain radiates from her elbow to her shoulder and across her back  She has tried diclofenac , cyclobenzaprine , prednisone , meloxicam, and gabapentin  without relief. She finds relief only during sleep. Tramadol and baclofen  have been previously ineffective or caused adverse effects. 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 She has not taken any medication today, denies NSAID use today.  She denies dizziness, shortness of breath, diaphoresis,  She can move her arm with pain and maintains a full range of motion.      Medications: Outpatient Medications Prior to Visit  Medication Sig   cyclobenzaprine  (FLEXERIL ) 10 MG tablet Take 1 tablet (10 mg total) by mouth at bedtime. Use as needed for muscle spasm   diclofenac  (VOLTAREN ) 75 MG EC tablet TAKE ONE TABLET BY MOUTH TWICE DAILY WITH FOOD AS NEEDED FOR PAIN   famotidine  (PEPCID ) 40 MG tablet Take 1 tablet (40 mg total) by mouth daily. Use as needed for stomach upset   gabapentin  (NEURONTIN ) 300 MG capsule 1 tab po at bedtime prn   ipratropium (ATROVENT ) 0.03  % nasal spray Place 2 sprays into the nose 4 (four) times daily.   montelukast  (SINGULAIR ) 10 MG tablet Take 10 mg by mouth at bedtime.   traZODone  (DESYREL ) 50 MG tablet Take 0.5-1 tablets (25-50 mg total) by mouth at bedtime as needed for sleep.   No facility-administered medications prior to visit.    Review of Systems  Constitutional:  Negative for fatigue and fever.  Respiratory:  Negative for cough and shortness of breath.   Cardiovascular:  Negative for chest pain and leg swelling.  Gastrointestinal:  Negative for abdominal pain.  Musculoskeletal:  Positive for arthralgias and myalgias.  Neurological:  Negative for dizziness and headaches.       Objective    BP 135/84   Pulse 89   Ht 5\' 6"  (1.676 m)   Wt 131 lb 12.8 oz (59.8 kg)   LMP 05/20/2016   BMI 21.27 kg/m    Physical Exam Vitals reviewed.  Constitutional:      General: She is awake.     Appearance: She is well-developed. She is not ill-appearing.  HENT:     Head: Normocephalic.  Eyes:     Conjunctiva/sclera: Conjunctivae normal.  Cardiovascular:     Rate and Rhythm: Normal rate and regular rhythm.     Heart sounds: Normal heart sounds.  Pulmonary:     Effort: Pulmonary effort is normal. No respiratory distress.     Breath sounds: Normal breath sounds.  Musculoskeletal:     Comments: Full ROM left arm with  pain    Skin:    General: Skin is warm.  Neurological:     Mental Status: She is alert and oriented to person, place, and time.  Psychiatric:        Attention and Perception: Attention normal.        Mood and Affect: Mood normal.        Speech: Speech normal.        Behavior: Behavior normal. Behavior is cooperative.      No results found for any visits on 06/07/23.  Assessment & Plan    Chest discomfort Left arm pain EKG normal sinus, appears similar to 01/2013 EKG. Recommending ice/heat, stretching,  30 mg IM toradol  admin today, rx oral toradol , advised NOT to combine with other  NSAIDS. Given failure of flexeril  and adverse reaction ( broken teeth?) to baclofen , rx a few diazepam for spasms. Stressed to pt if pain persists over the next weeks, to f/b with office for further recommendations ,would send to PT for eval.  -     EKG 12-Lead -     Ketorolac  Tromethamine  -     diazePAM; Take 1-2 tablets (2-4 mg total) by mouth every 8 (eight) hours as needed for muscle spasms.  Dispense: 10 tablet; Refill: 0 -     Ketorolac  Tromethamine ; Take 1 tablet (10 mg total) by mouth every 6 (six) hours as needed. Wait at least 6 hours after injection today 1:50 pm  Dispense: 10 tablet; Refill: 0   Return if symptoms worsen or fail to improve.       Trenton Frock, PA-C  [facility] at [facility] [phone] (phone) [phone] (fax)  [facility]
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
## 2023-07-11 ENCOUNTER — Ambulatory Visit: Payer: Self-pay

## 2023-07-11 NOTE — Telephone Encounter (Signed)
 FYI Only or Action Required?: FYI only for provider  Patient was last seen in primary care on 12/06/2022 by Copland, Skipper Dumas, MD. Called Nurse Triage reporting left arm. Symptoms began about a month ago. Interventions attempted: muscle relaxers. Symptoms are: gradually worsening.  Triage Disposition: See PCP When Office is Open (Within 3 Days)  Patient/caregiver understands and will follow disposition?: YES          Copied from CRM [id number]. Topic: Clinical - Red Word Triage >> Jul 11, 2023  4:01 PM Elle L wrote: Red Word that prompted transfer to Nurse Triage: The patient has been having spasms and pain on her left side. She feels like the nerves are inflamed. She was seen on 5/6 but the medication is not helping and she is requesting to see if Prednisone  can be called in as well. Reason for Disposition  [1] MODERATE pain (e.g., interferes with normal activities) AND [2] present > 3 days  Answer Assessment - Initial Assessment Questions 1. ONSET: "When did the pain start?"     Ongoing  2. LOCATION: "Where is the pain located?"     Left arm, back and chest 3. PAIN: "How bad is the pain?" (Scale 1-10; or mild, moderate, severe)   - MILD (1-3): Doesn't interfere with normal activities.   - MODERATE (4-7): Interferes with normal activities (e.g., work or school) or awakens from sleep.   - SEVERE (8-10): Excruciating pain, unable to do any normal activities, unable to hold a cup of water.    mod 5. CAUSE: "What do you think is causing the arm pain?"     Nerve damage 6. OTHER SYMPTOMS: "Do you have any other symptoms?" (e.g., neck pain, swelling, rash, fever, numbness, weakness)     spasms  Protocols used: Arm Pain-A-AH

## 2023-07-12 ENCOUNTER — Telehealth: Admitting: Physician Assistant

## 2023-07-12 DIAGNOSIS — M79602 Pain in left arm: Secondary | ICD-10-CM | POA: Diagnosis not present

## 2023-07-12 MED ORDER — CELECOXIB 100 MG PO CAPS: 100.0000 mg | ORAL_CAPSULE | Freq: Two times a day (BID) | ORAL | 0 refills | Status: DC

## 2023-07-12 NOTE — Progress Notes (Unsigned)
 MyChart Video Visit    Virtual Visit via Video Note   This format is felt to be most appropriate for this patient at this time. Physical exam was limited by quality of the video and audio technology used for the visit.   Patient location: in car, alone, not driving Provider location: [email or web address]  I discussed the limitations of evaluation and management by telemedicine and the availability of in person appointments. The patient expressed understanding and agreed to proceed.  Patient: Meghan Travis   DOB: 1965-06-03   58 y.o. Female  MRN: [id number] Visit Date: 07/12/2023  Today's healthcare provider: Trenton Frock, PA-C   Cc. Left sided arm, shoulder, chest pain  Subjective    HPI   Pt reports her chronic left sided shoulder pain that radiates down her left arm, left side of her chest, has not improved since our last visit 5/6. Denies any improvement with the toradol  injection, PO toradol , or valium . She is looking for something else for pain relief today.    Medications: Outpatient Medications Prior to Visit  Medication Sig   cyclobenzaprine  (FLEXERIL ) 10 MG tablet Take 1 tablet (10 mg total) by mouth at bedtime. Use as needed for muscle spasm   diazepam  (VALIUM ) 2 MG tablet Take 1-2 tablets (2-4 mg total) by mouth every 8 (eight) hours as needed for muscle spasms.   famotidine  (PEPCID ) 40 MG tablet Take 1 tablet (40 mg total) by mouth daily. Use as needed for stomach upset   gabapentin  (NEURONTIN ) 300 MG capsule 1 tab po at bedtime prn   ipratropium (ATROVENT ) 0.03 % nasal spray Place 2 sprays into the nose 4 (four) times daily.   montelukast  (SINGULAIR ) 10 MG tablet Take 10 mg by mouth at bedtime.   traZODone  (DESYREL ) 50 MG tablet Take 0.5-1 tablets (25-50 mg total) by mouth at bedtime as needed for sleep.   [DISCONTINUED] diclofenac  (VOLTAREN ) 75 MG EC tablet TAKE ONE TABLET BY MOUTH TWICE DAILY WITH FOOD AS NEEDED FOR PAIN   [DISCONTINUED] ketorolac  (TORADOL ) 10 MG tablet  Take 1 tablet (10 mg total) by mouth every 6 (six) hours as needed. Wait at least 6 hours after injection today 1:50 pm   No facility-administered medications prior to visit.    Review of Systems  Constitutional:  Negative for fatigue and fever.  Respiratory:  Negative for cough and shortness of breath.   Cardiovascular:  Positive for chest pain. Negative for leg swelling.  Gastrointestinal:  Negative for abdominal pain.  Musculoskeletal:  Positive for myalgias.  Neurological:  Negative for dizziness and headaches.        Objective    LMP 05/20/2016       Physical Exam Vitals reviewed.  Constitutional:      Appearance: She is not ill-appearing.  HENT:     Head: Normocephalic.  Eyes:     Conjunctiva/sclera: Conjunctivae normal.  Pulmonary:     Effort: Pulmonary effort is normal. No respiratory distress.  Neurological:     Mental Status: She is alert and oriented to person, place, and time.  Psychiatric:        Mood and Affect: Mood normal.        Behavior: Behavior normal.        Assessment & Plan    1. Left arm pain (Primary) - celecoxib (CELEBREX) 100 MG capsule; Take 1 capsule (100 mg total) by mouth 2 (two) times daily for 7 days.  Dispense: 14 capsule; Refill: 0 - Ambulatory referral to Physical Therapy  Last visit 5/6, normal EKG. On review, pt had reported between today and last visit that the various medicines have been ineffective for her pain:  Diclofenac , cyclobenzaprine , prednisone , meloxicam, gabapentin , tramadol, baclofen , Toradol , valium .   My recommendations last visit if pain did not improve was to be referred to ortho or PT. Pt agrees to PT referral today. Advised the only other medicine I would recommend at this time would be a trial of celebrex. 100 mg bid x 7 days sent.  Recommend further f/ups of this chronic condition be with pt's pcp.  Return if symptoms worsen or fail to improve.     I discussed the assessment and treatment plan  with the patient. The patient was provided an opportunity to ask questions and all were answered. The patient agreed with the plan and demonstrated an understanding of the instructions.   The patient was advised to call back or seek an in-person evaluation if the symptoms worsen or if the condition fails to improve as anticipated.  Trenton Frock, PA-C [facility] at [facility] [phone] (phone) [phone] (fax)  [facility]

## 2023-07-13 ENCOUNTER — Encounter: Payer: Self-pay | Admitting: Physician Assistant

## 2023-07-22 ENCOUNTER — Ambulatory Visit: Attending: Physician Assistant | Admitting: Physical Therapy

## 2023-07-22 ENCOUNTER — Encounter: Payer: Self-pay | Admitting: Physical Therapy

## 2023-07-22 ENCOUNTER — Other Ambulatory Visit: Payer: Self-pay

## 2023-07-22 DIAGNOSIS — M79602 Pain in left arm: Secondary | ICD-10-CM | POA: Insufficient documentation

## 2023-07-22 DIAGNOSIS — M62838 Other muscle spasm: Secondary | ICD-10-CM | POA: Insufficient documentation

## 2023-07-22 NOTE — Therapy (Signed)
 OUTPATIENT PHYSICAL THERAPY SHOULDER EVALUATION   Patient Name: Meghan Travis MRN: [id number] DOB:Dec 08, 1965, 58 y.o., female Today's Date: 07/22/2023  END OF SESSION:  PT End of Session - 07/22/23 1327     Visit Number 1    Number of Visits 4    Date for PT Re-Evaluation 08/19/23    PT Start Time 1111    PT Stop Time 1144    PT Time Calculation (min) 33 min    Activity Tolerance Patient tolerated treatment well    Behavior During Therapy WFL for tasks assessed/performed          Past Medical History:  Diagnosis Date   Allergy    Headache(784.0)    miagrine-   Past Surgical History:  Procedure Laterality Date   ANTERIOR CERVICAL DECOMP/DISCECTOMY FUSION N/A 02/19/2013   Procedure: CERVICAL FOUR-FIVE,CERVICAL FIVE -SIX ANTERIOR CERVICAL DECOMPRESSION/FUSION;  Surgeon: Elna Haggis, MD;  Location: [facility];  Service: Neurosurgery;  Laterality: N/A;   CESAREAN SECTION     TUBAL LIGATION     tubal reveral      Patient Active Problem List   Diagnosis Date Noted   Prediabetes 06/09/2020   Cervical spondylosis with myelopathy 02/19/2013   Herniated nucleus pulposus with myelopathy, cervical 01/13/2013   REFERRING PROVIDER: Trenton Frock  REFERRING DIAG: Left arm pain.  THERAPY DIAG:  Pain in left arm  Other muscle spasm  Rationale for Evaluation and Treatment: Rehabilitation  ONSET DATE: March 2025.  SUBJECTIVE:                                                                                                                                                                                      SUBJECTIVE STATEMENT: The patient presents to the clinic with a CC of left UE pain as the result of heavy cleaning of a tub in which she was using a drill with a brush attachment in March of this year.  She rates her pain at an 7-8/10.  She had a massage which was helpful and Chiropractic adjustment (she has one more visit to schedule).  Movement increases her pain.     PERTINENT HISTORY: ACDF C4-6 (2015).    PAIN:  Are you having pain? Yes: NPRS scale: 7-8/10. Pain location: Left cervical, scapular region, left elbow/forearm.   Pain description: Ache, throbbing and sharp.   Aggravating factors: Movement. Relieving factors: Sometimes rest.  PRECAUTIONS: Other: Prior ACDF.   FALLS:  Has patient fallen in last 6 months? No  LIVING ENVIRONMENT: Lives in: House/apartment Has following equipment at home: None  OCCUPATION: Cleans houses.  PLOF: Independent  PATIENT GOALS:Not have pain.    NEXT MD VISIT:  OBJECTIVE:    POSTURE: Very good posture.  UPPER EXTREMITY ROM:  Left UE AROM is WNL.    UPPER EXTREMITY MMT:  Normal UE strength.    DTR's:  Normal left UE DTR's.  SPECIAL TEST:  (-) Left Tennis elbow test.  PALPATION:  Tender to palpation over left UT and levator scapulae, along the medial scapular border and left forearm extensor musculature near the epicondylar attachment.                                                                                                                               TREATMENT DATE: Evaluation.     PATIENT EDUCATION: Education details: Patient is currently performing left wrist extension and flexion exercises as well as radial deviation.  Also, recommended scapular squeezes.   Person educated: Patient Education method: Explanation Education comprehension: verbalized understanding  HOME EXERCISE PROGRAM:   ASSESSMENT:  CLINICAL IMPRESSION: The patient presents to [facility] with c/o left UE that occurred at heavy cleaning of a tub in March of this year.  The patient is tender to palpation over left the UT and levator scapulae, along the medial scapular border and left forearm extensor musculature near the epicondylar attachment.  She had a massage that was helpful.  Recommended that she schedule another session with focus on the left UE and aforementioned musculature.  She has one more  Chiropractic visit she needs to schedule.  She is also currently performing left forearm exercises and I recommended she add scapular squeezes.  She demonstrates normal left UE strength.  Tennis elbow test is negative.  Left UE DTR's are normal.  OBJECTIVE IMPAIRMENTS: decreased activity tolerance, increased muscle spasms, and pain.   ACTIVITY LIMITATIONS: UE weight bearing.    PERSONAL FACTORS: 1 comorbidity: prior ACDF are also affecting patient's functional outcome.   REHAB POTENTIAL: Good  CLINICAL DECISION MAKING: Evolving/moderate complexity  EVALUATION COMPLEXITY: Moderate   GOALS:  SHORT TERM GOALS: Target date: 08/19/23  Ind with a HEP. Goal status: INITIAL   PLAN:  PT FREQUENCY/DURATION:  4 visits.  Patient limiting visits due to a high copay.  PLANNED INTERVENTIONS: 97110-Therapeutic exercises, 97530- Therapeutic activity, [id number]- Neuromuscular re-education, 97535- Self Care, 16109- Manual therapy, [id number]- Electrical stimulation (unattended), 97035- Ultrasound, Patient/Family education, and Moist heat  PLAN FOR NEXT SESSION: Trigger point release, theraband resisted scapular retraction.  Modaliites as needed.     Waldon Sheerin, [place], PT 07/22/2023, 2:10 PM

## 2023-07-25 ENCOUNTER — Telehealth: Payer: Self-pay | Admitting: Family Medicine

## 2023-07-25 DIAGNOSIS — M79602 Pain in left arm: Secondary | ICD-10-CM

## 2023-07-25 MED ORDER — CELECOXIB 100 MG PO CAPS: 100.0000 mg | ORAL_CAPSULE | Freq: Two times a day (BID) | ORAL | 0 refills | Status: AC

## 2023-07-25 NOTE — Telephone Encounter (Signed)
 Copied from CRM [id number]. Topic: Clinical - Medication Refill >> Jul 25, 2023 10:57 AM Meghan Travis wrote: Medication: celebrex    Has the patient contacted their pharmacy? Yes (Agent: If no, request that the patient contact the pharmacy for the refill. If patient does not wish to contact the pharmacy document the reason why and proceed with request.) (Agent: If yes, when and what did the pharmacy advise?)  This is the patient's preferred pharmacy:  [facility] - [place], [place] - [address] [place] [address] Phone: [phone] Fax: [phone]  Is this the correct pharmacy for this prescription? Yes If no, delete pharmacy and type the correct one.   Has the prescription been filled recently? No  Is the patient out of the medication? Yes  Has the patient been seen for an appointment in the last year OR does the patient have an upcoming appointment? Yes  Can we respond through MyChart? No  Agent: Please be advised that Rx refills may take up to 3 business days. We ask that you follow-up with your pharmacy.

## 2023-07-25 NOTE — Telephone Encounter (Signed)
 Routing encounter to PCP r/t medication request & medication not on med list   Copied from CRM [id number]. Topic: Clinical - Medication Refill >> Jul 25, 2023 10:57 AM Geroldine GRADE wrote: Medication: celebrex     Has the patient contacted their pharmacy? Yes (Agent: If no, request that the patient contact the pharmacy for the refill. If patient does not wish to contact the pharmacy document the reason why and proceed with request.) (Agent: If yes, when and what did the pharmacy advise?)   This is the patient's preferred pharmacy:  [facility] - [place], [place] - [address] [place] [address] Phone: [phone] Fax: [phone]   Is this the correct pharmacy for this prescription? Yes If no, delete pharmacy and type the correct one.    Has the prescription been filled recently? No   Is the patient out of the medication? Yes   Has the patient been seen for an appointment in the last year OR does the patient have an upcoming appointment? Yes   Can we respond through MyChart? No   Agent: Please be advised that Rx refills may take up to 3 business days. We ask that you follow-up with your pharmacy.

## 2024-01-04 ENCOUNTER — Other Ambulatory Visit: Payer: Self-pay | Admitting: Family Medicine

## 2024-01-04 DIAGNOSIS — Z1231 Encounter for screening mammogram for malignant neoplasm of breast: Secondary | ICD-10-CM

## 2024-01-27 NOTE — Progress Notes (Signed)
 [facility] [address], [address] [place], [place] [address] [phone]  Date:  01/30/2024   Name:  Meghan Travis   DOB:  [birth year]   MRN:  [id number]  PCP:  Watt Harlene BROCKS, MD    Chief Complaint: Annual Exam   History of Present Illness:  Meghan Travis is a 58 y.o. very pleasant female patient who presents with the following:  Patient seen today for physical exam.  I saw her most recently about 1 year ago also for her physical History of degenerative cervical disease, status post discectomy and fusion in 2015, prediabetes, vitamin D  deficiency  She did see my partner Manuelita Flatness in May with concern of left arm pain and numbness, thought to be musculoskeletal. This is better  She is separated/divorced.  Enjoys spending time with her grandson  Shingrix - recommended  Flu shot- give today  Tetanus appears to be due for booster- give today  Can offer pneumonia vaccine Mammogram is scheduled for the end of the year Cologuard is up-to-date-due fall 2026 Can update a Pap if she would like- update today Labs 1 year ago, can update  Wt Readings from Last 3 Encounters:  01/30/24 128 lb 3.2 oz (58.2 kg)  06/07/23 131 lb 12.8 oz (59.8 kg)  12/06/22 131 lb (59.4 kg)     Discussed the use of AI scribe software for clinical note transcription with the patient, who gave verbal consent to proceed.  History of Present Illness Meghan Travis is a 58 year old female who presents for routine follow-up and preventive care.  Her arm, which was previously a concern, has improved. 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 She works six to seven days a week, with thirteen to fourteen-hour workdays, which she acknowledges is physically demanding.  She has a mammogram scheduled for Wednesday and is due for a Cologuard update in 2026. She has not  had a tetanus booster recently.  Her neck has been doing well. No issues with chest pain or breathing difficulties during heavy work. Her digestion is okay, and she tries to avoid fried foods. 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  She is heavily involved in her business, working long hours, and has her daughter and another person assisting her. Her daughter works during the day and helps in the evening. She has a four-year-old grandson who is very active and into [employer]. Her daughter recently got engaged and is under contract to buy a home.   No PMB, no CP or SOB   Wt Readings from Last 3 Encounters:  01/30/24 128 lb 3.2 oz (58.2 kg)  06/07/23 131 lb 12.8 oz (59.8 kg)  12/06/22 131 lb (59.4 kg)     Patient Active Problem List   Diagnosis Date Noted   Prediabetes 06/09/2020   Cervical spondylosis with myelopathy 02/19/2013   Herniated nucleus pulposus with myelopathy, cervical 01/13/2013    Past Medical History:  Diagnosis Date   Allergy    Headache(784.0)    miagrine-    Past Surgical History:  Procedure Laterality Date   ANTERIOR CERVICAL DECOMP/DISCECTOMY FUSION N/A 02/19/2013   Procedure: CERVICAL FOUR-FIVE,CERVICAL FIVE -SIX ANTERIOR CERVICAL DECOMPRESSION/FUSION;  Surgeon: Victory Gens, MD;  Location: [facility];  Service: Neurosurgery;  Laterality:  N/A;   CESAREAN SECTION     TUBAL LIGATION     tubal reveral       Social History[1]  Family History  Problem Relation Age of Onset   Hyperlipidemia Mother    Hypertension Mother    Hyperlipidemia Father    Hypertension Father     Allergies[2]  Medication list has been reviewed and updated.  Medications Ordered Prior to Encounter[3]  Review of Systems: As per HPI- otherwise negative.   Physical Examination: Vitals:   01/30/24 1046  BP: 120/86  Pulse: 88  Temp: 98.1 F (36.7 C)  SpO2:  98%   Vitals:   01/30/24 1046  Weight: 128 lb 3.2 oz (58.2 kg)  Height: 5' 6 (1.676 m)   Body mass index is 20.69 kg/m. Ideal Body Weight: Weight in (lb) to have BMI = 25: 154.6  GEN: no acute distress. Slim build, looks well  HEENT: Atraumatic, Normocephalic.  Bilateral TM wnl, oropharynx normal.  PEERL,EOMI.   Ears and Nose: No external deformity. CV: RRR, No M/G/R. No JVD. No thrill. No extra heart sounds. PULM: CTA B, no wheezes, crackles, rhonchi. No retractions. No resp. distress. No accessory muscle use. ABD: S, NT, ND, +BS. No rebound. No HSM. EXTR: No c/c/e PSYCH: Normally interactive. Conversant.  Collected pap today- normal vulva, vagina, cervix   Assessment and Plan: Physical exam  Prediabetes - Plan: Comprehensive metabolic panel with GFR, Hemoglobin A1c  Thyroid  disorder screening - Plan: TSH  Screening for deficiency anemia - Plan: CBC  Screening, lipid - Plan: Lipid panel  Immunization due - Plan: Td vaccine greater than or equal to 7yo preservative free IM  Screening for cervical cancer - Plan: Cytology - PAP  Chronic neck pain - Plan: cyclobenzaprine  (FLEXERIL ) 10 MG tablet  Assessment & Plan Woman's Wellness Visit Routine wellness visit with no acute concerns. She is physically active, working long hours, and has lost just a couple of pounds with her increased workload.  no issues with chest pain or breathing during physical activity. Digestion is well-managed, and she avoids fried foods. - Performed Pap smear. - Administered flu shot. - Administered tetanus booster. - Ordered blood work including blood counts, metabolic profile, blood sugar, cholesterol, and thyroid  function. - Confirmed mammogram is scheduled for Wednesday. - Plan for colonoscopy screening in the fall.  Prediabetes No specific discussion or changes in management for prediabetes during this visit.  Signed Harlene Schroeder, MD Received labs as below, message to  patient  Results for orders placed or performed in visit on 01/30/24  CBC   Collection Time: 01/30/24 11:23 AM  Result Value Ref Range   WBC 2.5 (L) 4.0 - 10.5 K/uL   RBC 4.18 3.87 - 5.11 Mil/uL   Platelets 173.0 150.0 - 400.0 K/uL   Hemoglobin 13.2 12.0 - 15.0 g/dL   HCT 60.3 63.9 - 53.9 %   MCV 94.9 78.0 - 100.0 fl   MCHC 33.4 30.0 - 36.0 g/dL   RDW 86.6 88.4 - 84.4 %  Comprehensive metabolic panel with GFR   Collection Time: 01/30/24 11:23 AM  Result Value Ref Range   Sodium 140 135 - 145 mEq/L   Potassium 4.6 3.5 - 5.1 mEq/L   Chloride 103 96 - 112 mEq/L   CO2 29 19 - 32 mEq/L   Glucose, Bld 87 70 - 99 mg/dL   BUN 19 6 - 23 mg/dL   Creatinine, Ser 9.34 0.40 - 1.20 mg/dL   Total Bilirubin 0.6 0.2 - 1.2  mg/dL   Alkaline Phosphatase 114 39 - 117 U/L   AST 20 5 - 37 U/L   ALT 13 3 - 35 U/L   Total Protein 7.3 6.0 - 8.3 g/dL   Albumin 4.7 3.5 - 5.2 g/dL   GFR 02.49 >39.99 mL/min   Calcium 9.8 8.4 - 10.5 mg/dL  Hemoglobin J8r   Collection Time: 01/30/24 11:23 AM  Result Value Ref Range   Hgb A1c MFr Bld 5.9 4.6 - 6.5 %  Lipid panel   Collection Time: 01/30/24 11:23 AM  Result Value Ref Range   Cholesterol 197 28 - 200 mg/dL   Triglycerides 61.9 89.9 - 149.0 mg/dL   HDL 17.29 >60.99 mg/dL   VLDL 7.6 0.0 - 59.9 mg/dL   LDL Cholesterol 892 (H) 10 - 99 mg/dL   Total CHOL/HDL Ratio 2    NonHDL 114.58   TSH   Collection Time: 01/30/24 11:23 AM  Result Value Ref Range   TSH 1.04 0.35 - 5.50 uIU/mL       [1]  Social History Tobacco Use   Smoking status: Never   Smokeless tobacco: Never  Substance Use Topics   Alcohol use: No    Alcohol/week: 0.0 standard drinks of alcohol   Drug use: No  [2] No Known Allergies [3]  Current Outpatient Medications on File Prior to Visit  Medication Sig Dispense Refill   celecoxib  (CELEBREX ) 100 MG capsule Take 1 capsule (100 mg total) by mouth 2 (two) times daily. Use as needed for pain 45 capsule 0   diazepam  (VALIUM ) 2 MG  tablet Take 1-2 tablets (2-4 mg total) by mouth every 8 (eight) hours as needed for muscle spasms. 10 tablet 0   famotidine  (PEPCID ) 40 MG tablet Take 1 tablet (40 mg total) by mouth daily. Use as needed for stomach upset 90 tablet 3   gabapentin  (NEURONTIN ) 300 MG capsule 1 tab po at bedtime prn 90 capsule 1   ipratropium (ATROVENT ) 0.03 % nasal spray Place 2 sprays into the nose 4 (four) times daily. 30 mL 6   montelukast  (SINGULAIR ) 10 MG tablet Take 10 mg by mouth at bedtime.     traZODone  (DESYREL ) 50 MG tablet Take 0.5-1 tablets (25-50 mg total) by mouth at bedtime as needed for sleep. 30 tablet 5   No current facility-administered medications on file prior to visit.   "

## 2024-01-27 NOTE — Patient Instructions (Addendum)
 Good to see you again today, I will be in touch with your labs asap Flu shot and tetanus booster today Recommend shingles series and pneumonia vaccine at your convenience  Don't work too hard!

## 2024-01-30 ENCOUNTER — Ambulatory Visit: Admitting: Family Medicine

## 2024-01-30 ENCOUNTER — Other Ambulatory Visit (HOSPITAL_COMMUNITY)
Admission: RE | Admit: 2024-01-30 | Discharge: 2024-01-30 | Disposition: A | Discharge status: Home / Self Care | Source: Ambulatory Visit | Attending: Family Medicine | Admitting: Family Medicine

## 2024-01-30 ENCOUNTER — Encounter: Payer: Self-pay | Admitting: Family Medicine

## 2024-01-30 VITALS — BP 120/86 | HR 88 | Temp 98.1°F | Ht 66.0 in | Wt 128.2 lb

## 2024-01-30 DIAGNOSIS — Z13220 Encounter for screening for lipoid disorders: Secondary | ICD-10-CM | POA: Diagnosis not present

## 2024-01-30 DIAGNOSIS — M542 Cervicalgia: Secondary | ICD-10-CM

## 2024-01-30 DIAGNOSIS — Z0000 Encounter for general adult medical examination without abnormal findings: Secondary | ICD-10-CM | POA: Diagnosis not present

## 2024-01-30 DIAGNOSIS — D72819 Decreased white blood cell count, unspecified: Secondary | ICD-10-CM

## 2024-01-30 DIAGNOSIS — R7303 Prediabetes: Secondary | ICD-10-CM | POA: Diagnosis not present

## 2024-01-30 DIAGNOSIS — G8929 Other chronic pain: Secondary | ICD-10-CM | POA: Diagnosis not present

## 2024-01-30 DIAGNOSIS — Z23 Encounter for immunization: Secondary | ICD-10-CM

## 2024-01-30 DIAGNOSIS — Z1329 Encounter for screening for other suspected endocrine disorder: Secondary | ICD-10-CM | POA: Diagnosis not present

## 2024-01-30 DIAGNOSIS — Z124 Encounter for screening for malignant neoplasm of cervix: Secondary | ICD-10-CM | POA: Diagnosis present

## 2024-01-30 DIAGNOSIS — Z130 Encounter for screening for diseases of the blood and blood-forming organs and certain disorders involving the immune mechanism: Secondary | ICD-10-CM | POA: Diagnosis not present

## 2024-01-30 LAB — COMPREHENSIVE METABOLIC PANEL WITH GFR
ALT: 13 U/L (ref 3–35)
AST: 20 U/L (ref 5–37)
Albumin: 4.7 g/dL (ref 3.5–5.2)
Alkaline Phosphatase: 114 U/L (ref 39–117)
BUN: 19 mg/dL (ref 6–23)
CO2: 29 meq/L (ref 19–32)
Calcium: 9.8 mg/dL (ref 8.4–10.5)
Chloride: 103 meq/L (ref 96–112)
Creatinine, Ser: 0.65 mg/dL (ref 0.40–1.20)
GFR: 97.5 mL/min
Glucose, Bld: 87 mg/dL (ref 70–99)
Potassium: 4.6 meq/L (ref 3.5–5.1)
Sodium: 140 meq/L (ref 135–145)
Total Bilirubin: 0.6 mg/dL (ref 0.2–1.2)
Total Protein: 7.3 g/dL (ref 6.0–8.3)

## 2024-01-30 LAB — CBC
HCT: 39.6 % (ref 36.0–46.0)
Hemoglobin: 13.2 g/dL (ref 12.0–15.0)
MCHC: 33.4 g/dL (ref 30.0–36.0)
MCV: 94.9 fl (ref 78.0–100.0)
Platelets: 173 K/uL (ref 150.0–400.0)
RBC: 4.18 Mil/uL (ref 3.87–5.11)
RDW: 13.3 % (ref 11.5–15.5)
WBC: 2.5 K/uL — ABNORMAL LOW (ref 4.0–10.5)

## 2024-01-30 LAB — LIPID PANEL
Cholesterol: 197 mg/dL (ref 28–200)
HDL: 82.7 mg/dL
LDL Cholesterol: 107 mg/dL — ABNORMAL HIGH (ref 10–99)
NonHDL: 114.58
Total CHOL/HDL Ratio: 2
Triglycerides: 38 mg/dL (ref 10.0–149.0)
VLDL: 7.6 mg/dL (ref 0.0–40.0)

## 2024-01-30 LAB — TSH: TSH: 1.04 u[IU]/mL (ref 0.35–5.50)

## 2024-01-30 LAB — HEMOGLOBIN A1C: Hgb A1c MFr Bld: 5.9 % (ref 4.6–6.5)

## 2024-01-30 MED ORDER — CYCLOBENZAPRINE HCL 10 MG PO TABS: 10.0000 mg | ORAL_TABLET | Freq: Every day | ORAL | 1 refills | Status: AC

## 2024-01-31 ENCOUNTER — Encounter: Payer: Self-pay | Admitting: Family Medicine

## 2024-01-31 LAB — CYTOLOGY - PAP
Adequacy: ABSENT
Comment: NEGATIVE
Diagnosis: NEGATIVE
High risk HPV: NEGATIVE

## 2024-02-01 ENCOUNTER — Ambulatory Visit
Admission: RE | Admit: 2024-02-01 | Discharge: 2024-02-01 | Disposition: A | Discharge status: Home / Self Care | Source: Ambulatory Visit | Attending: Family Medicine | Admitting: Family Medicine

## 2024-02-01 DIAGNOSIS — Z1231 Encounter for screening mammogram for malignant neoplasm of breast: Secondary | ICD-10-CM

## 2024-02-10 ENCOUNTER — Encounter: Payer: Self-pay | Admitting: Hematology & Oncology

## 2024-03-05 ENCOUNTER — Inpatient Hospital Stay: Admitting: Medical Oncology

## 2024-03-05 ENCOUNTER — Inpatient Hospital Stay

## 2024-03-12 ENCOUNTER — Inpatient Hospital Stay: Admitting: Medical Oncology

## 2024-03-12 ENCOUNTER — Inpatient Hospital Stay
